# Patient Record
Sex: Male | Born: 1935 | Race: White | Hispanic: No | Marital: Married | Smoking: Current every day smoker
Health system: Southern US, Community
[De-identification: ages and names within clinical notes are randomized; demographics above are authoritative.]

## PROBLEM LIST (undated history)

## (undated) DIAGNOSIS — I10 Essential (primary) hypertension: Secondary | ICD-10-CM

## (undated) DIAGNOSIS — E785 Hyperlipidemia, unspecified: Secondary | ICD-10-CM

## (undated) DIAGNOSIS — I714 Abdominal aortic aneurysm, without rupture, unspecified: Secondary | ICD-10-CM

## (undated) DIAGNOSIS — K5792 Diverticulitis of intestine, part unspecified, without perforation or abscess without bleeding: Secondary | ICD-10-CM

## (undated) DIAGNOSIS — I639 Cerebral infarction, unspecified: Secondary | ICD-10-CM

## (undated) DIAGNOSIS — K219 Gastro-esophageal reflux disease without esophagitis: Secondary | ICD-10-CM

## (undated) HISTORY — PX: ABDOMINAL AORTIC ANEURYSM REPAIR: SUR1152

## (undated) HISTORY — PX: OTHER SURGICAL HISTORY: SHX169

---

## 2005-05-17 ENCOUNTER — Other Ambulatory Visit: Payer: Self-pay

## 2005-05-17 ENCOUNTER — Emergency Department: Payer: Self-pay | Admitting: Emergency Medicine

## 2006-01-16 ENCOUNTER — Ambulatory Visit: Payer: Self-pay | Admitting: Unknown Physician Specialty

## 2007-02-21 ENCOUNTER — Ambulatory Visit: Payer: Self-pay | Admitting: Ophthalmology

## 2007-03-13 ENCOUNTER — Ambulatory Visit: Payer: Self-pay | Admitting: Internal Medicine

## 2008-04-27 ENCOUNTER — Emergency Department: Payer: Self-pay | Admitting: Emergency Medicine

## 2008-09-09 ENCOUNTER — Ambulatory Visit: Payer: Self-pay | Admitting: Family Medicine

## 2009-01-06 ENCOUNTER — Ambulatory Visit: Payer: Self-pay | Admitting: Family Medicine

## 2009-09-23 ENCOUNTER — Ambulatory Visit: Payer: Self-pay | Admitting: Vascular Surgery

## 2009-10-14 ENCOUNTER — Emergency Department: Payer: Self-pay | Admitting: Emergency Medicine

## 2009-10-15 ENCOUNTER — Ambulatory Visit: Payer: Self-pay | Admitting: Vascular Surgery

## 2009-10-22 ENCOUNTER — Inpatient Hospital Stay: Payer: Self-pay | Admitting: Vascular Surgery

## 2011-08-09 IMAGING — XA IR VASCULAR PROCEDURE
7 series · 14 of 14 positions shown · IV contrast (IODINE)
Comparison: none

[Series 1: care aorta · 2 of 2 slices shown (1 of 7)]
[im 1/2]
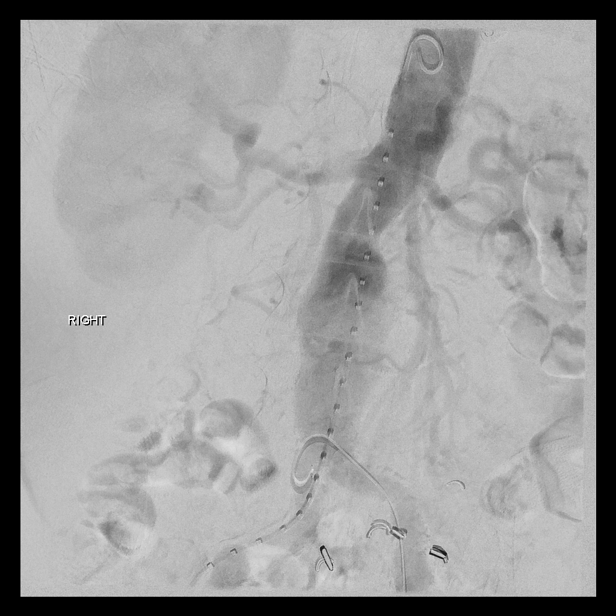
[im 2/2]
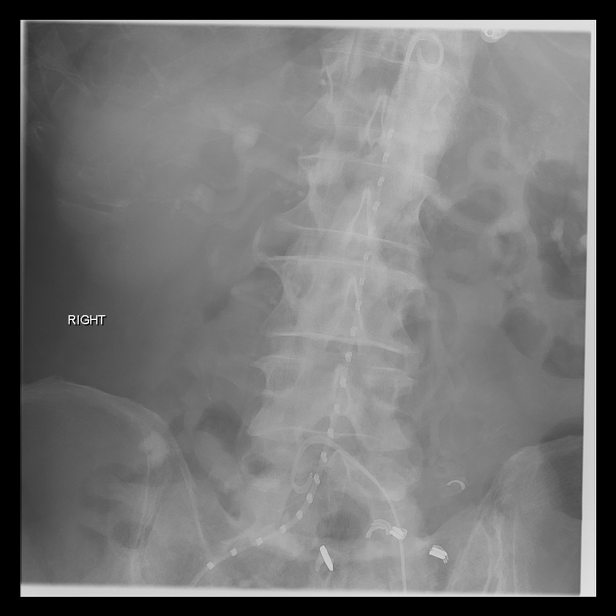

[Series 2: care aorta · 2 of 2 slices shown (2 of 7)]
[im 1/2]
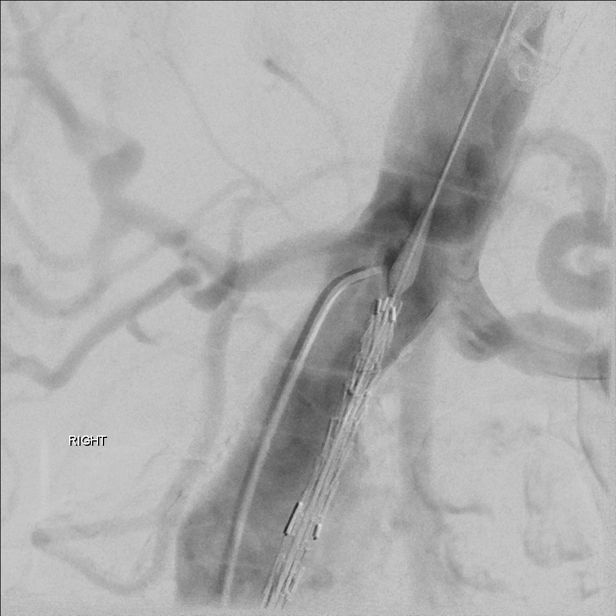
[im 2/2]
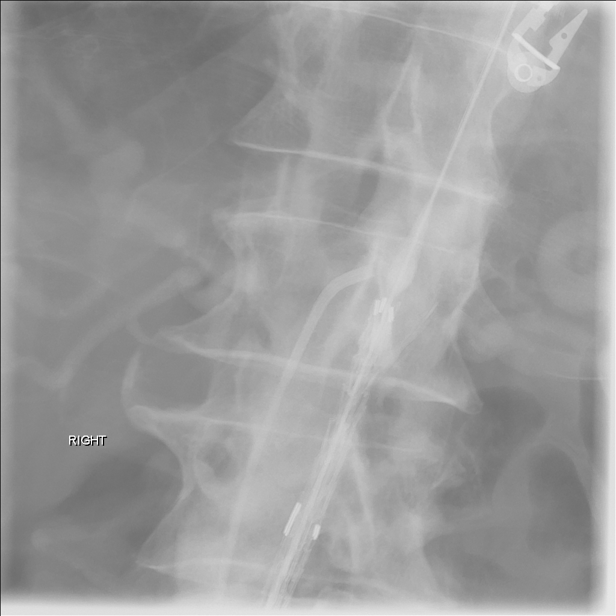

[Series 3: care aorta · 2 of 2 slices shown (3 of 7)]
[im 1/2]
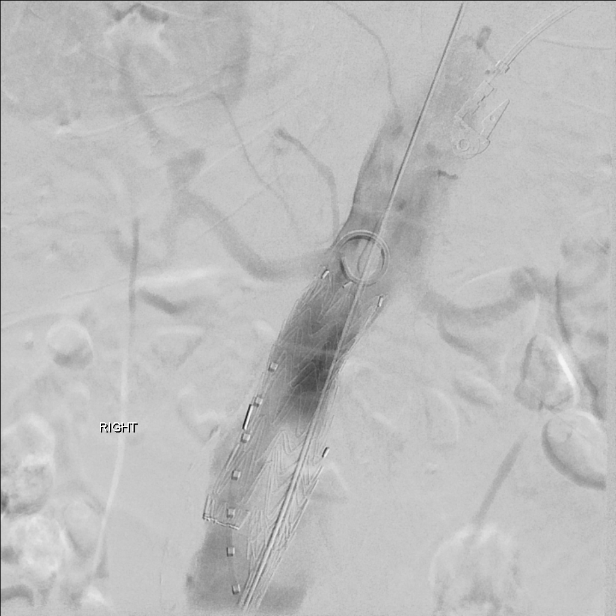
[im 2/2]
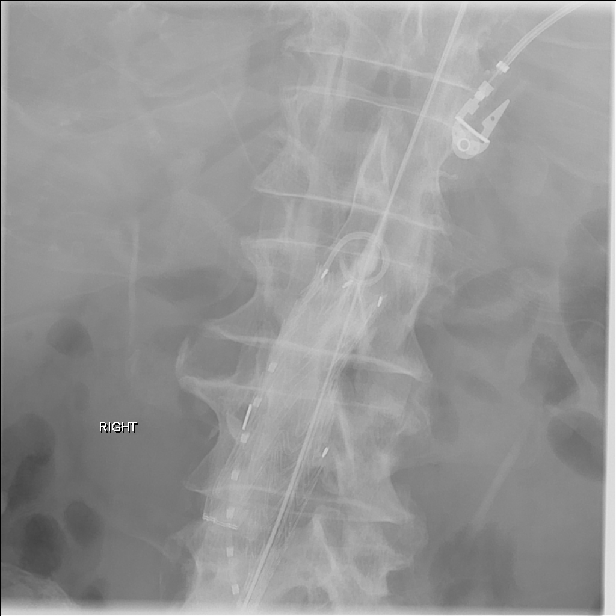

[Series 4: care aorta · 2 of 2 slices shown (4 of 7)]
[im 1/2]
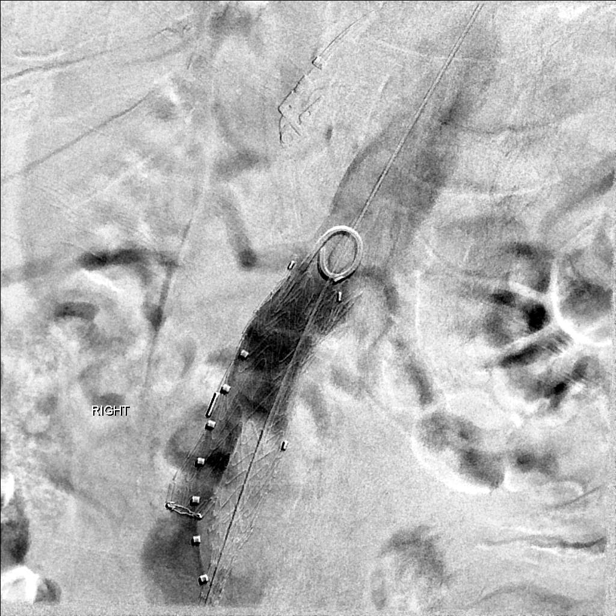
[im 2/2]
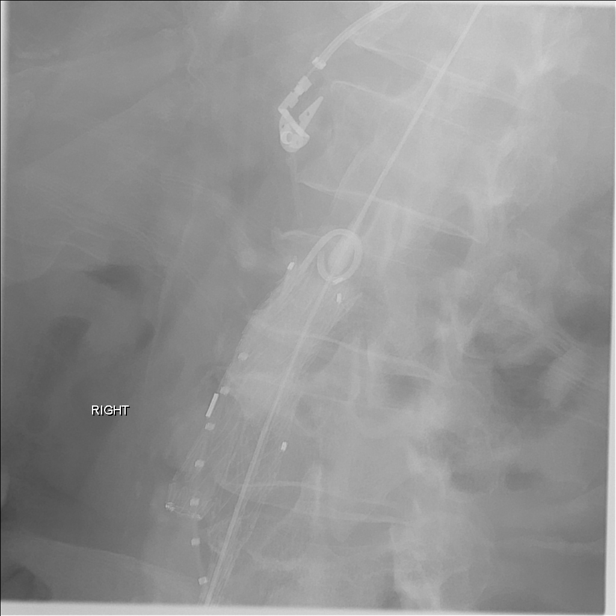

[Series 7: care aorta · 2 of 2 slices shown (5 of 7)]
[im 1/2]
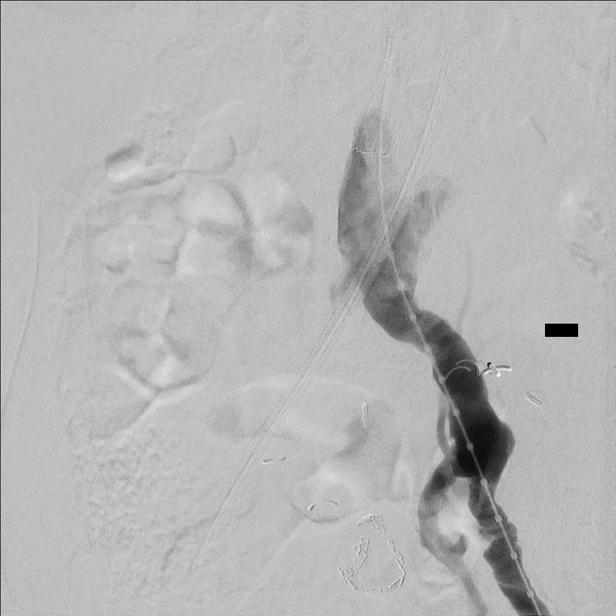
[im 2/2]
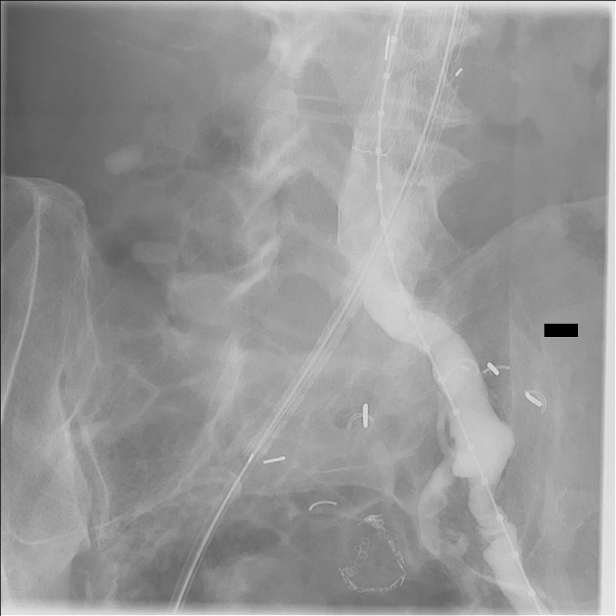

[Series 8: care aorta · 2 of 2 slices shown (6 of 7)]
[im 1/2]
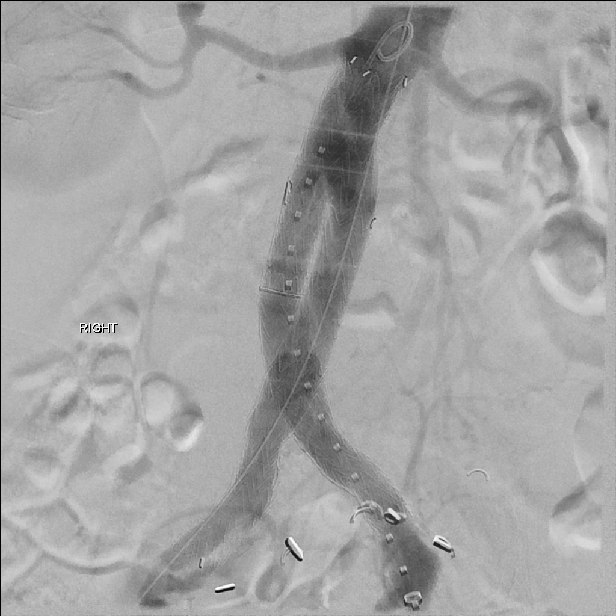
[im 2/2]
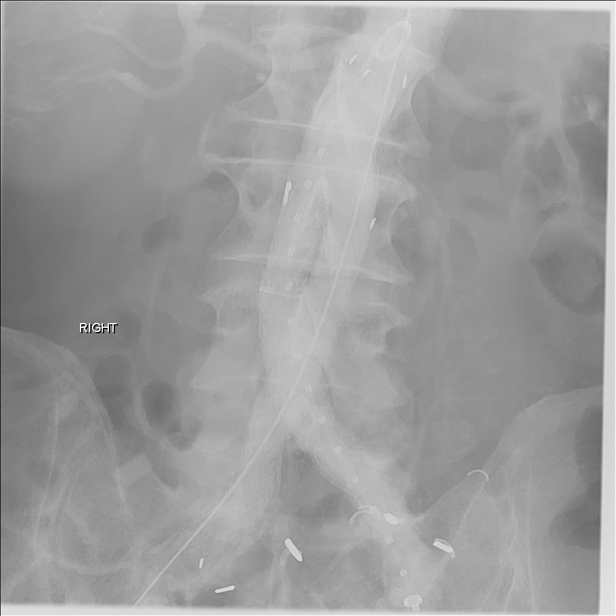

[Series 9: care aorta · 2 of 2 slices shown (7 of 7)]
[im 1/2]
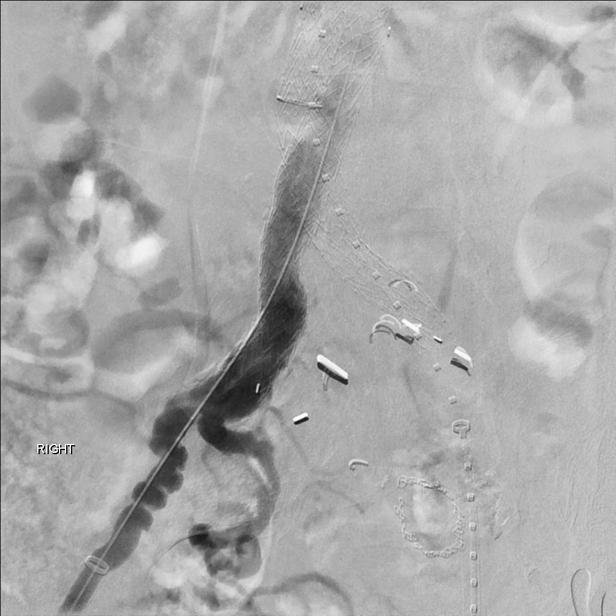
[im 2/2]
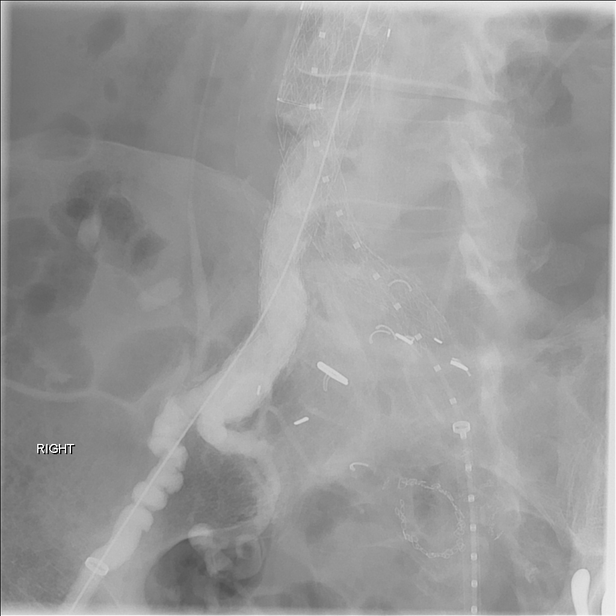

[14 of 14 positions shown; findings below may reference images not displayed]

IMAGES IMPORTED FROM THE SYNGO WORKFLOW SYSTEM
NO DICTATION FOR STUDY

## 2012-03-30 ENCOUNTER — Ambulatory Visit: Payer: Self-pay | Admitting: Sports Medicine

## 2016-07-08 ENCOUNTER — Emergency Department
Admission: EM | Admit: 2016-07-08 | Discharge: 2016-07-08 | Disposition: A | Discharge status: Home / Self Care | Payer: Medicare Other | Attending: Emergency Medicine | Admitting: Emergency Medicine

## 2016-07-08 ENCOUNTER — Encounter: Payer: Self-pay | Admitting: Medical Oncology

## 2016-07-08 DIAGNOSIS — I10 Essential (primary) hypertension: Secondary | ICD-10-CM

## 2016-07-08 DIAGNOSIS — I252 Old myocardial infarction: Secondary | ICD-10-CM | POA: Diagnosis not present

## 2016-07-08 DIAGNOSIS — Z8673 Personal history of transient ischemic attack (TIA), and cerebral infarction without residual deficits: Secondary | ICD-10-CM

## 2016-07-08 HISTORY — DX: Cerebral infarction, unspecified: I63.9

## 2016-07-08 HISTORY — DX: Abdominal aortic aneurysm, without rupture, unspecified: I71.40

## 2016-07-08 HISTORY — DX: Abdominal aortic aneurysm, without rupture: I71.4

## 2016-07-08 HISTORY — DX: Essential (primary) hypertension: I10

## 2016-07-08 MED ORDER — AMLODIPINE BESYLATE 5 MG PO TABS: 5.0000 mg | ORAL_TABLET | Freq: Every day | ORAL | 1 refills | Status: DC

## 2016-07-08 MED ORDER — ASPIRIN EC 81 MG PO TBEC: 81.0000 mg | DELAYED_RELEASE_TABLET | Freq: Every day | ORAL | 0 refills | Status: AC

## 2016-07-08 NOTE — ED Provider Notes (Signed)
[facility] Emergency Department Provider Note  ____________________________________________  Time seen: Approximately 9:28 AM  I have reviewed the triage vital signs and the nursing notes.   HISTORY  Chief Complaint Hypertension    HPI Dustin Townsend is a 83 y.o. male who sent to the ED for evaluation from the [facility] walk in clinic.He previously saw Dr. Dareen Piano, but hasn't been in about 3 years. He states that he stopped taking his blood pressure medicine and other medications about 2 years ago because he didn't like the way they made him feel. Last weekend episode where for 3 days he had decreased fine motor control in his right hand without frank paralysis. No other symptoms during that time. He started taking baby aspirin during that time and his hand function recovered. Denies any headaches or vision changes paresthesias or weakness anywhere else during the past week. No falls or head trauma. No chest pain belly pain or back pain.  Has a history of strokes in the past about 10 and 12 years ago. Has a history of AAA, status post endovascular repair about 6 years ago.     Allergies   Active Allergy Reactions Severity Noted Date Comments  Aspirin-Dipyridamole Headache  06/06/2013    Current Medications   Prescription Sig. Disp. Refills Start Date End Date Status  aspirin 81 MG EC tablet  Take 81 mg by mouth once daily.     Active  dutasteride (AVODART) 0.5 mg capsule  Take 0.5 mg by mouth once daily.     Active  pravastatin (PRAVACHOL) 10 MG tablet  Take 10 mg by mouth nightly.     Active  ACETAMINOPHEN (TYLENOL EXTRA STRENGTH ORAL)  Take by mouth.     Active  cholecalciferol (VITAMIN D3) 1,000 unit capsule  Take 1,000 Units by mouth once daily.     Active  omeprazole (PRILOSEC OTC) 20 MG tablet  Take 1 tablet (20 mg total) by mouth once daily. 90 tablet  3 03/05/2014  Active   Active Problems   Problem Noted  Date  HLD (hyperlipidemia), unspecified   Congenital foot deformity   CVA (cerebral vascular accident) (CMS-HCC)   GERD (gastroesophageal reflux disease)   Colon cancer (CMS-HCC)   Diverticulosis   Squamous cell skin cancer   Central retinal artery occlusion of left eye   Tobacco use   Colon adenoma, unspecified   AAA (abdominal aortic aneurysm) (CMS-HCC)   Overview:   s/p stent    Encounters - from Last 3 Months  Date Type Specialty Care Team Description  07/08/2016 Initial consult Internal Medicine Simonne Martinet, MD  Right arm weakness (Primary Dx);  History of CVA (cerebrovascular accident)   Immunizations   Name Dates Previously Given Next Due  Influenza, IM unspecified 10/15/2015    Surgical History   Surgery Date Laterality Comments  COLECTOMY PARTIAL W/ANASTAMOSIS     polypectomy (colon)     AAA stent         Past Medical History:  Diagnosis Date  . AAA (abdominal aortic aneurysm) (HCC)   . CVA (cerebral vascular accident) (HCC)   . Hypertension      There are no active problems to display for this patient.    Past Surgical History:  Procedure Laterality Date  . ABDOMINAL AORTIC ANEURYSM REPAIR       Prior to Admission medications   Not on File  None   Allergies Patient has no known allergies.   No family history on file.  Social History Social History  Substance Use Topics  . Smoking status: Not on file  . Smokeless tobacco: Not on file  . Alcohol use Not on file  No tobacco or alcohol use  Review of Systems  Constitutional:   No fever or chills.  ENT:   No sore throat. No rhinorrhea. Cardiovascular:   No chest pain or syncope. Respiratory:   No dyspnea or cough. Gastrointestinal:   Negative for abdominal pain, vomiting and diarrhea.  Musculoskeletal:   Negative for focal pain or swelling All other systems reviewed and are negative except as documented above in ROS and  HPI.  ____________________________________________   PHYSICAL EXAM:  VITAL SIGNS: ED Triage Vitals  Enc Vitals Group     BP 07/08/16 0836 (!) 177/107     Pulse Rate 07/08/16 0836 73     Resp 07/08/16 0836 18     Temp 07/08/16 0836 98 F (36.7 C)     Temp Source 07/08/16 0836 Oral     SpO2 07/08/16 0836 98 %     Weight 07/08/16 0836 170 lb (77.1 kg)     Height 07/08/16 0836 5\' 8"  (1.727 m)     Head Circumference --      [facility] Flow --      Pain Score 07/08/16 0835 0     Pain Loc --      Pain Edu? --      Excl. in GC? --     Vital signs reviewed, nursing assessments reviewed.   Constitutional:   Alert and oriented. Well appearing and in no distress. Eyes:   No scleral icterus.  EOMI. No nystagmus. No conjunctival pallor. PERRL. ENT   Head:   Normocephalic and atraumatic.   Nose:   No congestion/rhinnorhea.    Mouth/Throat:   MMM, no pharyngeal erythema. No peritonsillar mass.    Neck:   No meningismus. Full ROM Hematological/Lymphatic/Immunilogical:   No cervical lymphadenopathy. Cardiovascular:   RRR. Symmetric bilateral radial and DP pulses.  No murmurs.  Respiratory:   Normal respiratory effort without tachypnea/retractions. Breath sounds are clear and equal bilaterally. No wheezes/rales/rhonchi. Gastrointestinal:   Soft and nontender. Non distended. There is no CVA tenderness.  No rebound, rigidity, or guarding. Genitourinary:   deferred Musculoskeletal:   Normal range of motion in all extremities. No joint effusions.  No lower extremity tenderness.  No edema. Neurologic:   Normal speech and language.  Normal coordination Negative pronator drift. Normal finger-nose-finger. Motor grossly intact. No gross focal neurologic deficits are appreciated.  Skin:    Skin is warm, dry and intact. No rash noted.  No petechiae, purpura, or bullae.  ____________________________________________    LABS (pertinent positives/negatives) (all labs ordered are listed,  but only abnormal results are displayed) Labs Reviewed - No data to display ____________________________________________   EKG  Interpreted by me Sinus rhythm rate of 65, normal axis and intervals. Incomplete right bundle branch block. Normal ST segments and T waves. No ischemic changes  ____________________________________________    RADIOLOGY  No results found.  ____________________________________________   PROCEDURES Procedures  ____________________________________________   INITIAL IMPRESSION / ASSESSMENT AND PLAN / ED COURSE  Pertinent labs & imaging results that were available during my care of the patient were reviewed by me and considered in my medical decision making (see chart for details).  Patient well appearing no acute distress, currently asymptomatic, presents with uncontrolled essential hypertension as well as symptoms possibly due to ischemic stroke 1 week ago. He is currently asymptomatic, neurologically  intact, suitable for further outpatient management. I'll start him on amlodipine for blood pressure control and daily baby aspirin in the meantime. Counseled him on the need for outpatient stroke workup as well as restarting statin and probably further blood pressure control. I feel that starting all these medications today will likely cause significant side effects and cause the patient to stop taking his medicines again, as he stopped taking them 2 years ago because he didn't like the side effects. See no reason to do a CT head or MRI brain in the ED today, patient does not require hospitalization. Counseled extensively on concerns about the blood pressure and importance of medication compliance      ____________________________________________   FINAL CLINICAL IMPRESSION(S) / ED DIAGNOSES  Final diagnoses:  Essential hypertension  History of stroke in prior 3 months      New Prescriptions   No medications on file     Portions of this note  were generated with dragon dictation software. Dictation errors may occur despite best attempts at proofreading.    Sharman Cheek, MD 07/08/16 [phone]

## 2016-07-08 NOTE — ED Triage Notes (Signed)
Pt reports that he noticed last week that he lost control of his rt hand and couldn't write for a couple of days, that has now resolved. Pt is A/O x 4 with no weakness noted. Pt talking without difficulty. Pt [facility] with [facility], pt states that he has not taken his medications in over 2 years.

## 2016-07-08 NOTE — Discharge Instructions (Signed)
Follow-up with your primary care doctor for continued management of your blood pressure. You will also need outpatient evaluation for stroke risk factors, as well as restarting your statin therapy.  Take amlodipine for blood pressure and aspirin in the meantime.

## 2018-01-15 ENCOUNTER — Inpatient Hospital Stay
Admission: EM | Admit: 2018-01-15 | Discharge: 2018-01-19 | DRG: 470 | Disposition: A | Discharge status: Skilled Nursing Facility | Payer: Medicare Other | Attending: Internal Medicine | Admitting: Internal Medicine

## 2018-01-15 ENCOUNTER — Other Ambulatory Visit: Payer: Self-pay

## 2018-01-15 ENCOUNTER — Emergency Department: Payer: Medicare Other

## 2018-01-15 ENCOUNTER — Encounter: Payer: Self-pay | Admitting: Emergency Medicine

## 2018-01-15 DIAGNOSIS — Z96649 Presence of unspecified artificial hip joint: Secondary | ICD-10-CM

## 2018-01-15 DIAGNOSIS — Z716 Tobacco abuse counseling: Secondary | ICD-10-CM

## 2018-01-15 DIAGNOSIS — Z8719 Personal history of other diseases of the digestive system: Secondary | ICD-10-CM

## 2018-01-15 DIAGNOSIS — R52 Pain, unspecified: Secondary | ICD-10-CM

## 2018-01-15 DIAGNOSIS — K219 Gastro-esophageal reflux disease without esophagitis: Secondary | ICD-10-CM | POA: Diagnosis present

## 2018-01-15 DIAGNOSIS — R338 Other retention of urine: Secondary | ICD-10-CM | POA: Diagnosis not present

## 2018-01-15 DIAGNOSIS — Z419 Encounter for procedure for purposes other than remedying health state, unspecified: Secondary | ICD-10-CM

## 2018-01-15 DIAGNOSIS — E876 Hypokalemia: Secondary | ICD-10-CM | POA: Diagnosis not present

## 2018-01-15 DIAGNOSIS — I10 Essential (primary) hypertension: Secondary | ICD-10-CM | POA: Diagnosis present

## 2018-01-15 DIAGNOSIS — E785 Hyperlipidemia, unspecified: Secondary | ICD-10-CM | POA: Diagnosis present

## 2018-01-15 DIAGNOSIS — Z8679 Personal history of other diseases of the circulatory system: Secondary | ICD-10-CM | POA: Diagnosis not present

## 2018-01-15 DIAGNOSIS — Z9049 Acquired absence of other specified parts of digestive tract: Secondary | ICD-10-CM

## 2018-01-15 DIAGNOSIS — K5900 Constipation, unspecified: Secondary | ICD-10-CM | POA: Diagnosis not present

## 2018-01-15 DIAGNOSIS — W010XXA Fall on same level from slipping, tripping and stumbling without subsequent striking against object, initial encounter: Secondary | ICD-10-CM | POA: Diagnosis present

## 2018-01-15 DIAGNOSIS — Z79899 Other long term (current) drug therapy: Secondary | ICD-10-CM

## 2018-01-15 DIAGNOSIS — M25552 Pain in left hip: Secondary | ICD-10-CM | POA: Diagnosis present

## 2018-01-15 DIAGNOSIS — I69351 Hemiplegia and hemiparesis following cerebral infarction affecting right dominant side: Secondary | ICD-10-CM

## 2018-01-15 DIAGNOSIS — Y92129 Unspecified place in nursing home as the place of occurrence of the external cause: Secondary | ICD-10-CM | POA: Diagnosis not present

## 2018-01-15 DIAGNOSIS — Z8249 Family history of ischemic heart disease and other diseases of the circulatory system: Secondary | ICD-10-CM | POA: Diagnosis not present

## 2018-01-15 DIAGNOSIS — S72002A Fracture of unspecified part of neck of left femur, initial encounter for closed fracture: Secondary | ICD-10-CM

## 2018-01-15 DIAGNOSIS — S72009A Fracture of unspecified part of neck of unspecified femur, initial encounter for closed fracture: Secondary | ICD-10-CM | POA: Diagnosis present

## 2018-01-15 DIAGNOSIS — S72112A Displaced fracture of greater trochanter of left femur, initial encounter for closed fracture: Secondary | ICD-10-CM | POA: Diagnosis present

## 2018-01-15 DIAGNOSIS — F17200 Nicotine dependence, unspecified, uncomplicated: Secondary | ICD-10-CM | POA: Diagnosis present

## 2018-01-15 DIAGNOSIS — W19XXXA Unspecified fall, initial encounter: Secondary | ICD-10-CM

## 2018-01-15 HISTORY — DX: Gastro-esophageal reflux disease without esophagitis: K21.9

## 2018-01-15 HISTORY — DX: Hyperlipidemia, unspecified: E78.5

## 2018-01-15 HISTORY — DX: Diverticulitis of intestine, part unspecified, without perforation or abscess without bleeding: K57.92

## 2018-01-15 LAB — CBC WITH DIFFERENTIAL/PLATELET
Abs Immature Granulocytes: 0.03 10*3/uL (ref 0.00–0.07)
BASOS ABS: 0 10*3/uL (ref 0.0–0.1)
Basophils Relative: 0 %
EOS ABS: 0.1 10*3/uL (ref 0.0–0.5)
EOS PCT: 1 %
HEMATOCRIT: 41.8 % (ref 39.0–52.0)
Hemoglobin: 14.3 g/dL (ref 13.0–17.0)
Immature Granulocytes: 0 %
LYMPHS ABS: 1.9 10*3/uL (ref 0.7–4.0)
Lymphocytes Relative: 21 %
MCH: 33.6 pg (ref 26.0–34.0)
MCHC: 34.2 g/dL (ref 30.0–36.0)
MCV: 98.4 fL (ref 80.0–100.0)
MONO ABS: 0.5 10*3/uL (ref 0.1–1.0)
MONOS PCT: 6 %
NRBC: 0 % (ref 0.0–0.2)
Neutro Abs: 6.4 10*3/uL (ref 1.7–7.7)
Neutrophils Relative %: 72 %
Platelets: 196 10*3/uL (ref 150–400)
RBC: 4.25 MIL/uL (ref 4.22–5.81)
RDW: 13.1 % (ref 11.5–15.5)
WBC: 8.9 10*3/uL (ref 4.0–10.5)

## 2018-01-15 LAB — BASIC METABOLIC PANEL
Anion gap: 9 (ref 5–15)
BUN: 22 mg/dL (ref 8–23)
CO2: 24 mmol/L (ref 22–32)
CREATININE: 0.85 mg/dL (ref 0.61–1.24)
Calcium: 9.3 mg/dL (ref 8.9–10.3)
Chloride: 105 mmol/L (ref 98–111)
GFR calc Af Amer: >60 mL/min (ref >60)
GFR calc non Af Amer: >60 mL/min (ref >60)
GLUCOSE: 106 mg/dL — AB (ref 70–99)
Potassium: 3.9 mmol/L (ref 3.5–5.1)
Sodium: 138 mmol/L (ref 135–145)

## 2018-01-15 LAB — TYPE AND SCREEN
ABO/RH(D): O NEG
Antibody Screen: NEGATIVE

## 2018-01-15 LAB — SURGICAL PCR SCREEN
MRSA, PCR: NEGATIVE
Staphylococcus aureus: NEGATIVE

## 2018-01-15 MED ORDER — TRAMADOL HCL 50 MG PO TABS
50.0000 mg | ORAL_TABLET | Freq: Four times a day (QID) | ORAL | Status: DC | PRN
  Administered 2018-01-15: 50 mg via ORAL
  Filled 2018-01-15: qty 1

## 2018-01-15 MED ORDER — HYDROMORPHONE HCL 1 MG/ML IJ SOLN
0.5000 mg | Freq: Once | INTRAMUSCULAR | Status: AC
  Administered 2018-01-15: 0.5 mg via INTRAVENOUS
  Filled 2018-01-15: qty 1

## 2018-01-15 MED ORDER — HYDRALAZINE HCL 20 MG/ML IJ SOLN: 10.0000 mg | Freq: Four times a day (QID) | INTRAMUSCULAR | Status: DC | PRN

## 2018-01-15 MED ORDER — SODIUM CHLORIDE 0.9 % IV SOLN
INTRAVENOUS | Status: DC
  Administered 2018-01-15: 16:00:00 via INTRAVENOUS

## 2018-01-15 MED ORDER — LISINOPRIL 20 MG PO TABS
20.0000 mg | ORAL_TABLET | Freq: Every day | ORAL | Status: DC
  Administered 2018-01-16: 20 mg via ORAL
  Filled 2018-01-15: qty 2

## 2018-01-15 MED ORDER — HYDROMORPHONE HCL 1 MG/ML IJ SOLN
1.0000 mg | INTRAMUSCULAR | Status: DC | PRN
  Administered 2018-01-15 – 2018-01-16 (×4): 1 mg via INTRAVENOUS
  Filled 2018-01-15 (×4): qty 1

## 2018-01-15 MED ORDER — HYDROCHLOROTHIAZIDE 25 MG PO TABS
25.0000 mg | ORAL_TABLET | Freq: Every day | ORAL | Status: DC
  Administered 2018-01-16: 25 mg via ORAL
  Filled 2018-01-15: qty 1

## 2018-01-15 MED ORDER — ONDANSETRON HCL 4 MG PO TABS: 4.0000 mg | ORAL_TABLET | Freq: Four times a day (QID) | ORAL | Status: DC | PRN

## 2018-01-15 MED ORDER — PRAVASTATIN SODIUM 20 MG PO TABS
10.0000 mg | ORAL_TABLET | Freq: Every day | ORAL | Status: DC
  Administered 2018-01-16 – 2018-01-17 (×2): 10 mg via ORAL
  Filled 2018-01-15 (×4): qty 1

## 2018-01-15 MED ORDER — HYDROMORPHONE HCL 1 MG/ML IJ SOLN: 2.0000 mg | INTRAMUSCULAR | Status: DC | PRN

## 2018-01-15 MED ORDER — ONDANSETRON HCL 4 MG/2ML IJ SOLN: 4.0000 mg | Freq: Four times a day (QID) | INTRAMUSCULAR | Status: DC | PRN

## 2018-01-15 MED ORDER — SODIUM CHLORIDE 0.9 % IV SOLN
INTRAVENOUS | Status: DC
  Administered 2018-01-15 – 2018-01-16 (×2): via INTRAVENOUS

## 2018-01-15 MED ORDER — MUPIROCIN 2 % EX OINT
1.0000 "application " | TOPICAL_OINTMENT | Freq: Two times a day (BID) | CUTANEOUS | Status: DC
  Administered 2018-01-15: 1 via NASAL
  Filled 2018-01-15: qty 22

## 2018-01-15 MED ORDER — ACETAMINOPHEN 650 MG RE SUPP: 650.0000 mg | Freq: Four times a day (QID) | RECTAL | Status: DC | PRN

## 2018-01-15 MED ORDER — ACETAMINOPHEN 325 MG PO TABS: 650.0000 mg | ORAL_TABLET | Freq: Four times a day (QID) | ORAL | Status: DC | PRN

## 2018-01-15 MED ORDER — DUTASTERIDE 0.5 MG PO CAPS
0.5000 mg | ORAL_CAPSULE | Freq: Every day | ORAL | Status: DC
  Administered 2018-01-17 – 2018-01-19 (×3): 0.5 mg via ORAL
  Filled 2018-01-15 (×5): qty 1

## 2018-01-15 NOTE — ED Provider Notes (Signed)
ED ECG REPORT I, Anne-Caroline Sharma Covert, the attending physician, personally viewed and interpreted this ECG.   Date: 01/15/2018  EKG Time:1554  Rate: 68  Rhythm: normal sinus rhythm; + PVC  Axis: normal  Intervals:none  ST&T Change: No STEMI    Rockne Menghini, MD 01/15/18 1559

## 2018-01-15 NOTE — Progress Notes (Addendum)
    01/15/18 1725  Clinical Encounter Type  Visited With Patient and family together  Visit Type Spiritual support;Initial  Referral From Physician  Consult/Referral To Chaplain  Spiritual Encounters  Spiritual Needs Emotional  Stress Factors  Patient Stress Factors Exhausted;Health changes;Major life changes;Lack of knowledge  Family Stress Factors Not reviewed  Advance Directives (For Healthcare)  Does Patient Have a Medical Advance Directive? No  Would patient like information on creating a medical advance directive? Yes (Inpatient - patient requests chaplain consult to create a medical advance directive)  Mental Health Advance Directives  Does Patient Have a Mental Health Advance Directive? No  Would patient like information on creating a mental health advance directive? No - Patient declined   Received a OR on pt regarding an Adv directive. Visited with pt briefly to find out why he is here. Pt had just been placed in the room from ER. Spoke briefly about the adv directive w/ pt who had 2 sons present. Educated the pt and the sons on the purpose of the adv directive. Pt shared how he became hospitalized w/ hip fracture and that it would require surgery. Chaplain noticed how weary the pt was and the presence of the family and decided that f/u would be best considering he just moved form the ER. Chaplain joked w/ sons and the pt and assured them that someone could talk to him a time that would be more convenient. Contact was made w/ the nurse and physician via chat and page to update the care team.

## 2018-01-15 NOTE — Consult Note (Signed)
ORTHOPAEDIC CONSULTATION  REQUESTING PHYSICIAN: Auburn Bilberry, MD  Chief Complaint:   L hip pain  History of Present Illness: Dustin Townsend is a 85 y.o. male who had a fall earlier today [facility].  The patient noted immediate hip pain and inability to ambulate.  The patient ambulates unassisted at baseline and lives independently with his wife.  Pain is described as sharp at its worst and a dull ache at its best.  Pain is rated a 10 out of 10 in severity.  Pain is improved with rest and immobilization.  Pain is worse with any sort of movement.  X-rays in the emergency department show a left femoral neck fracture.  Past Medical History:  Diagnosis Date  . AAA (abdominal aortic aneurysm) (HCC)   . CVA (cerebral vascular accident) (HCC)   . Diverticulitis   . GERD (gastroesophageal reflux disease)   . Hyperlipemia   . Hypertension    Past Surgical History:  Procedure Laterality Date  . ABDOMINAL AORTIC ANEURYSM REPAIR    . colectomy     Social History   Socioeconomic History  . Marital status: Married    Spouse name: Not on file  . Number of children: Not on file  . Years of education: Not on file  . Highest education level: Not on file  Occupational History  . Not on file  Social Needs  . Financial resource strain: Not on file  . Food insecurity:    Worry: Not on file    Inability: Not on file  . Transportation needs:    Medical: Not on file    Non-medical: Not on file  Tobacco Use  . Smoking status: Current Every Day Smoker  . Smokeless tobacco: Never Used  Substance and Sexual Activity  . Alcohol use: Not on file  . Drug use: Not on file  . Sexual activity: Not on file  Lifestyle  . Physical activity:    Days per week: Not on file    Minutes per session: Not on file  . Stress: Not on file  Relationships  . Social connections:    Talks on phone: Not on file     Gets together: Not on file    Attends religious service: Not on file    Active member of club or organization: Not on file    Attends meetings of clubs or organizations: Not on file    Relationship status: Not on file  Other Topics Concern  . Not on file  Social History Narrative  . Not on file   Family History  Problem Relation Age of Onset  . Hypertension Mother    No Known Allergies Prior to Admission medications   Medication Sig Start Date End Date Taking? Authorizing Provider  amLODipine (NORVASC) 5 MG tablet Take 1 tablet (5 mg total) by mouth daily. 07/08/16   Sharman Cheek, MD   Recent Labs    01/15/18 1504  WBC 8.9  HGB 14.3  HCT 41.8  PLT 196  K 3.9  CL 105  CO2 24  BUN 22  CREATININE 0.85  GLUCOSE 106*  CALCIUM 9.3     Dg Chest 1 View  Result Date: 01/15/2018 CLINICAL DATA:  Loss of balance.  Fall. EXAM: CHEST  1 VIEW COMPARISON:  Chest x-ray 10/15/2009. FINDINGS: Mediastinum hilar structures normal. Cardiomegaly. No pulmonary venous congestion. No focal infiltrate. No pleural effusion or pneumothorax. No acute bony abnormality identified. IMPRESSION: 1.  Cardiomegaly.  No  pulmonary venous congestion. 2.  No acute pulmonary disease. Electronically Signed   By: Maisie Fus  Register   On: 01/15/2018 14:27   Ct Hip Left Wo Contrast  Result Date: 01/15/2018 CLINICAL DATA:  Fracture of the proximal left femur secondary to a fall. EXAM: CT OF THE LEFT HIP WITHOUT CONTRAST TECHNIQUE: Multidetector CT imaging of the left hip was performed according to the standard protocol. Multiplanar CT image reconstructions were also generated. COMPARISON:  Radiographs dated 01/15/2018 FINDINGS: Bones/Joint/Cartilage There is a minimally comminuted fracture of the neck of the proximal left femur with slight angulation and impaction. Fracture does not involve the trochanters. There is no dislocation. Soft tissues Atherosclerosis.  No acute soft tissue abnormalities. IMPRESSION:  Slightly angulated slightly impacted left femoral neck fracture. Electronically Signed   By: Francene Boyers M.D.   On: 01/15/2018 15:54   Dg Hip Unilat W Or Wo Pelvis 2-3 Views Left  Result Date: 01/15/2018 CLINICAL DATA:  Status post fall with left hip pain. EXAM: DG HIP (WITH OR WITHOUT PELVIS) 2-3V LEFT COMPARISON:  None. FINDINGS: There is an oblique mildly comminuted impacted fracture of the left distal femoral neck extending to the greater trochanter. There is mild superior displacement of the distal fracture fragment. Postsurgical changes in the pelvis noted. IMPRESSION: Oblique impacted intertrochanteric left femoral fracture. Electronically Signed   By: Ted Mcalpine M.D.   On: 01/15/2018 14:23     Positive ROS: All other systems have been reviewed and were otherwise negative with the exception of those mentioned in the HPI and as above.  Physical Exam: BP (!) 162/92 (BP Location: Right Arm)   Pulse 63   Temp 98 F (36.7 C) (Oral)   Resp 18   Ht 5\' 11"  (1.803 m)   Wt 83.9 kg   SpO2 99%   BMI 25.80 kg/m  General:  Alert, no acute distress Psychiatric:  Patient is competent for consent with normal mood and affect   Cardiovascular:  No pedal edema, regular rate and rhythm Respiratory:  No wheezing, non-labored breathing GI:  Abdomen is soft and non-tender Skin:  No lesions in the area of chief complaint, no erythema Neurologic:  Sensation intact distally, CN grossly intact Lymphatic:  No axillary or cervical lymphadenopathy  Orthopedic Exam:  LLE: + DF/PF/EHL SILT grossly over foot Foot wwp +Log roll/axial load   X-rays:  As above: L displaced femoral neck fracture  Assessment/Plan: Dustin Townsend is a 85 y.o. male with a L displaced femoral neck fracture   1. I discussed the various treatment options including both surgical and non-surgical management of her fracture with the patient and family. We discussed the high risk of perioperative complications due to  patient's age and other co-morbidities. After discussion of risks, benefits, and alternatives to surgery, the family and patient were in agreement to proceed with surgery. The goals of surgery would be to provide adequate pain relief and allow for early mobilization. Plan for surgery is L hip hemiarthroplasty tomorrow, 01/16/18. 2. NPO after midnight 3. Hold anticoagulation in advance of OR 4. Admit to Hospitalist service     Signa Kell   01/15/2018 6:58 PM

## 2018-01-15 NOTE — Progress Notes (Signed)
Notified Dr. Auburn Bilberry that surgery will not be done tonight per Dr. Signa Kell and patient can eat. MD to place order for diet.

## 2018-01-15 NOTE — H&P (Signed)
[employer] [facility] - [place] [facility]   PATIENT NAME: Dustin Townsend    MR#:  [id number]  DATE OF BIRTH:  27-Sep-1933  DATE OF ADMISSION:  01/15/2018  PRIMARY CARE PHYSICIAN: Patient, No Pcp Per   REQUESTING/REFERRING PHYSICIAN: Nona Dell, PA  CHIEF COMPLAINT:   Chief Complaint  Patient presents with  . Fall  . Hip Pain    HISTORY OF PRESENT ILLNESS: Dustin Townsend  is a 85 y.o. male with a known history of abdominal aortic aneurysm, previous CVA, diverticulitis, GERD, hyperlipidemia and hypertension who is presenting to the hospital with fall.  Patient states that he was throwing balloon on the wall with his wife who is in rehab.  Patient fell.  In the ER he was noted to have a hip fracture.  He otherwise states that he does have some gait trouble.  Denies any chest pain or shortness of breath.   PAST MEDICAL HISTORY:   Past Medical History:  Diagnosis Date  . AAA (abdominal aortic aneurysm) (HCC)   . CVA (cerebral vascular accident) (HCC)   . Diverticulitis   . GERD (gastroesophageal reflux disease)   . Hyperlipemia   . Hypertension     PAST SURGICAL HISTORY:  Past Surgical History:  Procedure Laterality Date  . ABDOMINAL AORTIC ANEURYSM REPAIR    . colectomy      SOCIAL HISTORY:  Social History   Tobacco Use  . Smoking status: Current Every Day Smoker  . Smokeless tobacco: Never Used  Substance Use Topics  . Alcohol use: Not on file    FAMILY HISTORY:  Family History  Problem Relation Age of Onset  . Hypertension Mother     DRUG ALLERGIES: No Known Allergies  REVIEW OF SYSTEMS:   CONSTITUTIONAL: No fever, fatigue or weakness.  EYES: No blurred or double vision.  EARS, NOSE, AND THROAT: No tinnitus or ear pain.  RESPIRATORY: No cough, shortness of breath, wheezing or hemoptysis.  CARDIOVASCULAR: No chest pain, orthopnea, edema.  GASTROINTESTINAL: No nausea, vomiting, diarrhea or abdominal pain.  GENITOURINARY: No dysuria, hematuria.   ENDOCRINE: No polyuria, nocturia,  HEMATOLOGY: No anemia, easy bruising or bleeding SKIN: No rash or lesion. MUSCULOSKELETAL: Positive hip pain  nEUROLOGIC: No tingling, numbness, weakness.  PSYCHIATRY: No anxiety or depression.   MEDICATIONS AT HOME:  Prior to Admission medications   Medication Sig Start Date End Date Taking? Authorizing Provider  amLODipine (NORVASC) 5 MG tablet Take 1 tablet (5 mg total) by mouth daily. 07/08/16   Sharman Cheek, MD      PHYSICAL EXAMINATION:   VITAL SIGNS: Blood pressure (!) 190/110, pulse 80, temperature 98.4 F (36.9 C), temperature source Oral, resp. rate 20, height 5\' 11"  (1.803 m), weight 83.9 kg, SpO2 97 %.  GENERAL:  85 y.o.-year-old patient lying in the bed with no acute distress.  EYES: Pupils equal, round, reactive to light and accommodation. No scleral icterus. Extraocular muscles intact.  HEENT: Head atraumatic, normocephalic. Oropharynx and nasopharynx clear.  NECK:  Supple, no jugular venous distention. No thyroid enlargement, no tenderness.  LUNGS: Normal breath sounds bilaterally, no wheezing, rales,rhonchi or crepitation. No use of accessory muscles of respiration.  CARDIOVASCULAR: S1, S2 normal. No murmurs, rubs, or gallops.  ABDOMEN: Soft, nontender, nondistended. Bowel sounds present. No organomegaly or mass.  EXTREMITIES: No pedal edema, cyanosis, or clubbing.  NEUROLOGIC: Cranial nerves II through XII are intact. Muscle strength 5/5 in all extremities. Sensation intact. Gait not checked.  PSYCHIATRIC: The patient is alert and oriented x  3.  SKIN: No obvious rash, lesion, or ulcer.   LABORATORY PANEL:   CBC Recent Labs  Lab 01/15/18 1504  WBC 8.9  HGB 14.3  HCT 41.8  PLT 196  MCV 98.4  MCH 33.6  MCHC 34.2  RDW 13.1  LYMPHSABS 1.9  MONOABS 0.5  EOSABS 0.1  BASOSABS 0.0   ------------------------------------------------------------------------------------------------------------------  Chemistries  No  results for input(s): NA, K, CL, CO2, GLUCOSE, BUN, CREATININE, CALCIUM, MG, AST, ALT, ALKPHOS, BILITOT in the last 168 hours.  Invalid input(s): GFRCGP ------------------------------------------------------------------------------------------------------------------ CrCl cannot be calculated (No successful lab value found.). ------------------------------------------------------------------------------------------------------------------ No results for input(s): TSH, T4TOTAL, T3FREE, THYROIDAB in the last 72 hours.  Invalid input(s): FREET3   Coagulation profile No results for input(s): INR, PROTIME in the last 168 hours. ------------------------------------------------------------------------------------------------------------------- No results for input(s): DDIMER in the last 72 hours. -------------------------------------------------------------------------------------------------------------------  Cardiac Enzymes No results for input(s): CKMB, TROPONINI, MYOGLOBIN in the last 168 hours.  Invalid input(s): CK ------------------------------------------------------------------------------------------------------------------ Invalid input(s): POCBNP  ---------------------------------------------------------------------------------------------------------------  Urinalysis No results found for: COLORURINE, APPEARANCEUR, LABSPEC, PHURINE, GLUCOSEU, HGBUR, BILIRUBINUR, KETONESUR, PROTEINUR, UROBILINOGEN, NITRITE, LEUKOCYTESUR   RADIOLOGY: Dg Chest 1 View  Result Date: 01/15/2018 CLINICAL DATA:  Loss of balance.  Fall. EXAM: CHEST  1 VIEW COMPARISON:  Chest x-ray 10/15/2009. FINDINGS: Mediastinum hilar structures normal. Cardiomegaly. No pulmonary venous congestion. No focal infiltrate. No pleural effusion or pneumothorax. No acute bony abnormality identified. IMPRESSION: 1.  Cardiomegaly.  No pulmonary venous congestion. 2.  No acute pulmonary disease. Electronically Signed   By:  Maisie Fus  Register   On: 01/15/2018 14:27   Dg Hip Unilat W Or Wo Pelvis 2-3 Views Left  Result Date: 01/15/2018 CLINICAL DATA:  Status post fall with left hip pain. EXAM: DG HIP (WITH OR WITHOUT PELVIS) 2-3V LEFT COMPARISON:  None. FINDINGS: There is an oblique mildly comminuted impacted fracture of the left distal femoral neck extending to the greater trochanter. There is mild superior displacement of the distal fracture fragment. Postsurgical changes in the pelvis noted. IMPRESSION: Oblique impacted intertrochanteric left femoral fracture. Electronically Signed   By: Ted Mcalpine M.D.   On: 01/15/2018 14:23    EKG: Orders placed or performed during the hospital encounter of 01/15/18  . ED EKG  . ED EKG    IMPRESSION AND PLAN: Patient is 85 year old with history of hypertension hyperlipidemia presenting with fall  1.  Left femoral fracture orthopedics have been notified Plan for patient to go to surgery later N.p.o. Patient denies any cardiopulmonary symptoms medically cleared for surgery Await EKG evaluation CBC and BMP  2 accelerated hypertension I will place patient on IV hydralazine as needed Continue lisinopril HCTZ is taken at home  3.  Hyperlipidemia continue Pravachol  4.  Miscellaneous SCDs for DVT prophylaxis    All the records are reviewed and case discussed with ED provider. Management plans discussed with the patient, family and they are in agreement.  CODE STATUS: Full code   TOTAL TIME TAKING CARE OF THIS PATIENT:59minutes.    Auburn Bilberry M.D on 01/15/2018 at 3:29 PM  Between 7am to 6pm - Pager - [phone]  After 6pm go to [email or web address] - password [email or web address] [facility]  [employer] [facility] Office  [phone]  CC: Primary care physician; Patient, No Pcp Per

## 2018-01-15 NOTE — ED Triage Notes (Signed)
Presents vis EMS s/p fall  States he lost his balance while visiting wife at Nursing home landed on left hip   Having increased pain with standing   Also has skin tear to left thumb

## 2018-01-15 NOTE — ED Provider Notes (Signed)
[facility] Emergency Department Provider Note   ____________________________________________   First MD Initiated Contact with Patient 01/15/18 1334     (approximate)  I have reviewed the triage vital signs and the nursing notes.   HISTORY  Chief Complaint Fall and Hip Pain    HPI Dustin Townsend is a 85 y.o. male patient arrived via EMS with complaint of left hip pain secondary to a fall.  Patient lost his balance while visiting his wife in the nursing home.  Patient denies LOC or vertigo.  Patient states pain increased with weightbearing.  Patient rates the pain as a 6/10.  Patient described the pain is "achy".  No palliative measures prior to arrival.  Patient also sustained a skin tear to left thumb.  Past Medical History:  Diagnosis Date  . AAA (abdominal aortic aneurysm) (HCC)   . CVA (cerebral vascular accident) (HCC)   . Hypertension     There are no active problems to display for this patient.   Past Surgical History:  Procedure Laterality Date  . ABDOMINAL AORTIC ANEURYSM REPAIR      Prior to Admission medications   Medication Sig Start Date End Date Taking? Authorizing Provider  amLODipine (NORVASC) 5 MG tablet Take 1 tablet (5 mg total) by mouth daily. 07/08/16   Sharman Cheek, MD    Allergies Patient has no known allergies.  No family history on file.  Social History Social History   Tobacco Use  . Smoking status: Current Every Day Smoker  . Smokeless tobacco: Never Used  Substance Use Topics  . Alcohol use: Not on file  . Drug use: Not on file    Review of Systems Constitutional: No fever/chills Eyes: No visual changes. ENT: No sore throat. Cardiovascular: Denies chest pain. Respiratory: Denies shortness of breath. Gastrointestinal: No abdominal pain.  No nausea, no vomiting.  No diarrhea.  No constipation. Genitourinary: Negative for dysuria. Musculoskeletal: Negative for back pain. Skin: Negative for rash.   Skin tear left thumb. Neurological: Negative for headaches, focal weakness or numbness. Endocrine:Hypertension.  ____________________________________________   PHYSICAL EXAM:  VITAL SIGNS: ED Triage Vitals  Enc Vitals Group     BP 01/15/18 1322 (!) 190/110     Pulse Rate 01/15/18 1322 80     Resp 01/15/18 1322 20     Temp 01/15/18 1322 98.4 F (36.9 C)     Temp Source 01/15/18 1322 Oral     SpO2 01/15/18 1322 97 %     Weight 01/15/18 1317 185 lb (83.9 kg)     Height 01/15/18 1317 5\' 11"  (1.803 m)     Head Circumference --      Peak Flow --      Pain Score 01/15/18 1317 6     Pain Loc --      Pain Edu? --      Excl. in GC? --     Constitutional: Alert and oriented.  Moderate distress.   Neck: No cervical spine tenderness to palpation. Cardiovascular: Normal rate, regular rhythm. Grossly normal heart sounds.  Good peripheral circulation.  Elevated blood pressure. Respiratory: Normal respiratory effort.  No retractions. Lungs CTAB. Musculoskeletal: No obvious leg length discrepancy.  Patient is moderate guarding palpation to greater trochanter.  Decreased range of motion is all feels limited by complaint of pain.  Neurologic:  Normal speech and language. No gross focal neurologic deficits are appreciated. No gait instability. Skin:  Skin is warm, dry and intact. No rash noted. Psychiatric: Mood  and affect are normal. Speech and behavior are normal.  ____________________________________________   LABS (all labs ordered are listed, but only abnormal results are displayed)  Labs Reviewed  CBC WITH DIFFERENTIAL/PLATELET  BASIC METABOLIC PANEL  TYPE AND SCREEN   ____________________________________________  EKG   ____________________________________________  RADIOLOGY  ED MD interpretation:    Official radiology report(s): Dg Chest 1 View  Result Date: 01/15/2018 CLINICAL DATA:  Loss of balance.  Fall. EXAM: CHEST  1 VIEW COMPARISON:  Chest x-ray 10/15/2009.  FINDINGS: Mediastinum hilar structures normal. Cardiomegaly. No pulmonary venous congestion. No focal infiltrate. No pleural effusion or pneumothorax. No acute bony abnormality identified. IMPRESSION: 1.  Cardiomegaly.  No pulmonary venous congestion. 2.  No acute pulmonary disease. Electronically Signed   By: Maisie Fus  Register   On: 01/15/2018 14:27   Dg Hip Unilat W Or Wo Pelvis 2-3 Views Left  Result Date: 01/15/2018 CLINICAL DATA:  Status post fall with left hip pain. EXAM: DG HIP (WITH OR WITHOUT PELVIS) 2-3V LEFT COMPARISON:  None. FINDINGS: There is an oblique mildly comminuted impacted fracture of the left distal femoral neck extending to the greater trochanter. There is mild superior displacement of the distal fracture fragment. Postsurgical changes in the pelvis noted. IMPRESSION: Oblique impacted intertrochanteric left femoral fracture. Electronically Signed   By: Ted Mcalpine M.D.   On: 01/15/2018 14:23    ____________________________________________   PROCEDURES  Procedure(s) performed: None  Procedures  Critical Care performed: No  ____________________________________________   INITIAL IMPRESSION / ASSESSMENT AND PLAN / ED COURSE  As part of my medical decision making, I reviewed the following data within the electronic MEDICAL RECORD NUMBER    Patient presents with left hip pain secondary to a fall.  X-ray shows trochanter fracture.  Discussed patient with attending, on-call orthopedic, and hospitalist.  Patient will be admitted for surgery.     ____________________________________________   FINAL CLINICAL IMPRESSION(S) / ED DIAGNOSES  Final diagnoses:  Closed left hip fracture, initial encounter [facility])     ED Discharge Orders    None       Note:  This document was prepared using Dragon voice recognition software and may include unintentional dictation errors.    Joni Reining, PA-C 01/15/18 1512    Minna Antis, MD 01/15/18 1520

## 2018-01-16 ENCOUNTER — Inpatient Hospital Stay: Payer: Medicare Other | Admitting: Anesthesiology

## 2018-01-16 ENCOUNTER — Inpatient Hospital Stay: Payer: Medicare Other

## 2018-01-16 ENCOUNTER — Encounter
Admission: EM | Disposition: A | Discharge status: Skilled Nursing Facility | Payer: Self-pay | Source: Home / Self Care | Attending: Internal Medicine

## 2018-01-16 HISTORY — PX: HIP ARTHROPLASTY: SHX981

## 2018-01-16 LAB — PROTIME-INR
INR: 1.01
Prothrombin Time: 13.2 seconds (ref 11.4–15.2)

## 2018-01-16 LAB — CBC
HCT: 39.7 % (ref 39.0–52.0)
Hemoglobin: 13.3 g/dL (ref 13.0–17.0)
MCH: 33.3 pg (ref 26.0–34.0)
MCHC: 33.5 g/dL (ref 30.0–36.0)
MCV: 99.5 fL (ref 80.0–100.0)
Platelets: 181 10*3/uL (ref 150–400)
RBC: 3.99 MIL/uL — ABNORMAL LOW (ref 4.22–5.81)
RDW: 13.2 % (ref 11.5–15.5)
WBC: 7.5 10*3/uL (ref 4.0–10.5)
nRBC: 0 % (ref 0.0–0.2)

## 2018-01-16 LAB — BASIC METABOLIC PANEL
Anion gap: 7 (ref 5–15)
BUN: 21 mg/dL (ref 8–23)
CO2: 23 mmol/L (ref 22–32)
Calcium: 8.4 mg/dL — ABNORMAL LOW (ref 8.9–10.3)
Chloride: 107 mmol/L (ref 98–111)
Creatinine, Ser: 0.77 mg/dL (ref 0.61–1.24)
GFR calc Af Amer: >60 mL/min (ref >60)
GFR calc non Af Amer: >60 mL/min (ref >60)
Glucose, Bld: 104 mg/dL — ABNORMAL HIGH (ref 70–99)
Potassium: 3.6 mmol/L (ref 3.5–5.1)
Sodium: 137 mmol/L (ref 135–145)

## 2018-01-16 SURGERY — HEMIARTHROPLASTY, HIP, DIRECT ANTERIOR APPROACH, FOR FRACTURE
Anesthesia: Spinal | Laterality: Left

## 2018-01-16 MED ORDER — SODIUM CHLORIDE 0.9 % IV SOLN
INTRAVENOUS | Status: DC
  Administered 2018-01-17 – 2018-01-18 (×3): via INTRAVENOUS

## 2018-01-16 MED ORDER — BUPIVACAINE LIPOSOME 1.3 % IJ SUSP
INTRAMUSCULAR | Status: DC | PRN
  Administered 2018-01-16: 50 mL

## 2018-01-16 MED ORDER — SODIUM CHLORIDE 0.9 % IR SOLN
Status: DC | PRN
  Administered 2018-01-16: 1000 mL

## 2018-01-16 MED ORDER — TRAMADOL HCL 50 MG PO TABS
50.0000 mg | ORAL_TABLET | Freq: Four times a day (QID) | ORAL | Status: DC | PRN
  Administered 2018-01-16 – 2018-01-19 (×6): 50 mg via ORAL
  Filled 2018-01-16 (×6): qty 1

## 2018-01-16 MED ORDER — PROPOFOL 500 MG/50ML IV EMUL
INTRAVENOUS | Status: AC
  Filled 2018-01-16: qty 50

## 2018-01-16 MED ORDER — MENTHOL 3 MG MT LOZG
1.0000 | LOZENGE | OROMUCOSAL | Status: DC | PRN
  Filled 2018-01-16: qty 9

## 2018-01-16 MED ORDER — GLYCOPYRROLATE 0.2 MG/ML IJ SOLN
INTRAMUSCULAR | Status: DC | PRN
  Administered 2018-01-16: 0.2 mg via INTRAVENOUS

## 2018-01-16 MED ORDER — OXYCODONE HCL 5 MG PO TABS
2.5000 mg | ORAL_TABLET | ORAL | Status: DC | PRN
  Administered 2018-01-16 – 2018-01-19 (×7): 5 mg via ORAL
  Filled 2018-01-16 (×7): qty 1

## 2018-01-16 MED ORDER — METOCLOPRAMIDE HCL 5 MG/ML IJ SOLN: 5.0000 mg | Freq: Three times a day (TID) | INTRAMUSCULAR | Status: DC | PRN

## 2018-01-16 MED ORDER — ENOXAPARIN SODIUM 40 MG/0.4ML ~~LOC~~ SOLN
40.0000 mg | SUBCUTANEOUS | Status: DC
  Administered 2018-01-17 – 2018-01-19 (×3): 40 mg via SUBCUTANEOUS
  Filled 2018-01-16 (×3): qty 0.4

## 2018-01-16 MED ORDER — FENTANYL CITRATE (PF) 100 MCG/2ML IJ SOLN: 25.0000 ug | INTRAMUSCULAR | Status: DC | PRN

## 2018-01-16 MED ORDER — BISACODYL 5 MG PO TBEC: 5.0000 mg | DELAYED_RELEASE_TABLET | Freq: Every day | ORAL | Status: DC | PRN

## 2018-01-16 MED ORDER — TRANEXAMIC ACID-NACL 1000-0.7 MG/100ML-% IV SOLN
1000.0000 mg | INTRAVENOUS | Status: DC
  Filled 2018-01-16: qty 100

## 2018-01-16 MED ORDER — PHENOL 1.4 % MT LIQD
1.0000 | OROMUCOSAL | Status: DC | PRN
  Filled 2018-01-16: qty 177

## 2018-01-16 MED ORDER — GLYCOPYRROLATE 0.2 MG/ML IJ SOLN
INTRAMUSCULAR | Status: AC
  Filled 2018-01-16: qty 1

## 2018-01-16 MED ORDER — ONDANSETRON HCL 4 MG/2ML IJ SOLN: 4.0000 mg | Freq: Once | INTRAMUSCULAR | Status: DC | PRN

## 2018-01-16 MED ORDER — LACTATED RINGERS IV SOLN
INTRAVENOUS | Status: DC
  Administered 2018-01-16 (×2): via INTRAVENOUS

## 2018-01-16 MED ORDER — ONDANSETRON HCL 4 MG PO TABS: 4.0000 mg | ORAL_TABLET | Freq: Four times a day (QID) | ORAL | Status: DC | PRN

## 2018-01-16 MED ORDER — BUPIVACAINE HCL (PF) 0.5 % IJ SOLN
INTRAMUSCULAR | Status: DC | PRN
  Administered 2018-01-16: 3 mL

## 2018-01-16 MED ORDER — MIDAZOLAM HCL 2 MG/2ML IJ SOLN
INTRAMUSCULAR | Status: AC
  Filled 2018-01-16: qty 2

## 2018-01-16 MED ORDER — ACETAMINOPHEN 10 MG/ML IV SOLN
INTRAVENOUS | Status: DC | PRN
  Administered 2018-01-16: 1000 mg via INTRAVENOUS

## 2018-01-16 MED ORDER — PROPOFOL 10 MG/ML IV BOLUS
INTRAVENOUS | Status: DC | PRN
  Administered 2018-01-16: 30 mg via INTRAVENOUS
  Administered 2018-01-16 (×3): 20 mg via INTRAVENOUS

## 2018-01-16 MED ORDER — BISACODYL 10 MG RE SUPP
10.0000 mg | Freq: Every day | RECTAL | Status: DC | PRN
  Administered 2018-01-18: 10 mg via RECTAL
  Filled 2018-01-16: qty 1

## 2018-01-16 MED ORDER — CEFAZOLIN SODIUM-DEXTROSE 2-4 GM/100ML-% IV SOLN
2.0000 g | Freq: Three times a day (TID) | INTRAVENOUS | Status: DC
  Administered 2018-01-16: 2 g via INTRAVENOUS
  Filled 2018-01-16 (×4): qty 100

## 2018-01-16 MED ORDER — METOCLOPRAMIDE HCL 10 MG PO TABS: 5.0000 mg | ORAL_TABLET | Freq: Three times a day (TID) | ORAL | Status: DC | PRN

## 2018-01-16 MED ORDER — DOCUSATE SODIUM 100 MG PO CAPS: 100.0000 mg | ORAL_CAPSULE | Freq: Two times a day (BID) | ORAL | Status: DC

## 2018-01-16 MED ORDER — MIDAZOLAM HCL 5 MG/5ML IJ SOLN
INTRAMUSCULAR | Status: DC | PRN
  Administered 2018-01-16: 2 mg via INTRAVENOUS

## 2018-01-16 MED ORDER — SENNOSIDES-DOCUSATE SODIUM 8.6-50 MG PO TABS: 1.0000 | ORAL_TABLET | Freq: Every evening | ORAL | Status: DC | PRN

## 2018-01-16 MED ORDER — CEFAZOLIN SODIUM-DEXTROSE 2-4 GM/100ML-% IV SOLN
2.0000 g | Freq: Four times a day (QID) | INTRAVENOUS | Status: AC
  Administered 2018-01-16 – 2018-01-17 (×2): 2 g via INTRAVENOUS
  Filled 2018-01-16 (×2): qty 100

## 2018-01-16 MED ORDER — ACETAMINOPHEN 10 MG/ML IV SOLN
INTRAVENOUS | Status: AC
  Filled 2018-01-16: qty 100

## 2018-01-16 MED ORDER — SODIUM CHLORIDE 0.9 % IV SOLN
INTRAVENOUS | Status: DC | PRN
  Administered 2018-01-16: 40 ug/min via INTRAVENOUS

## 2018-01-16 MED ORDER — PROPOFOL 500 MG/50ML IV EMUL
INTRAVENOUS | Status: DC | PRN
  Administered 2018-01-16: 65 ug/kg/min via INTRAVENOUS

## 2018-01-16 MED ORDER — DOCUSATE SODIUM 100 MG PO CAPS
100.0000 mg | ORAL_CAPSULE | Freq: Two times a day (BID) | ORAL | Status: DC
  Administered 2018-01-16 – 2018-01-18 (×5): 100 mg via ORAL
  Filled 2018-01-16 (×6): qty 1

## 2018-01-16 MED ORDER — ONDANSETRON HCL 4 MG/2ML IJ SOLN: 4.0000 mg | Freq: Four times a day (QID) | INTRAMUSCULAR | Status: DC | PRN

## 2018-01-16 SURGICAL SUPPLY — 66 items
BLADE SAGITTAL AGGR TOOTH XLG (BLADE) ×2 IMPLANT
BLADE SAGITTAL WIDE XTHICK NO (BLADE) ×3 IMPLANT
BLADE SAW SAG 29X58X.64 (BLADE) ×2 IMPLANT
BLADE SURG SZ10 CARB STEEL (BLADE) ×3 IMPLANT
BNDG COHESIVE 4X5 TAN STRL (GAUZE/BANDAGES/DRESSINGS) ×3 IMPLANT
CANISTER SUCT 1200ML W/VALVE (MISCELLANEOUS) ×3 IMPLANT
CANISTER SUCT 3000ML PPV (MISCELLANEOUS) ×6 IMPLANT
CHLORAPREP W/TINT 26ML (MISCELLANEOUS) ×3 IMPLANT
COVER WAND RF STERILE (DRAPES) ×1 IMPLANT
DERMABOND ADVANCED (GAUZE/BANDAGES/DRESSINGS) ×2
DERMABOND ADVANCED .7 DNX12 (GAUZE/BANDAGES/DRESSINGS) ×1 IMPLANT
DRAPE IMP U-DRAPE 54X76 (DRAPES) ×3 IMPLANT
DRAPE INCISE IOBAN 66X60 STRL (DRAPES) ×3 IMPLANT
DRAPE SHEET LG 3/4 BI-LAMINATE (DRAPES) ×6 IMPLANT
DRAPE SURG 17X11 SM STRL (DRAPES) ×3 IMPLANT
DRAPE TABLE BACK 80X90 (DRAPES) ×3 IMPLANT
DRSG OPSITE POSTOP 4X10 (GAUZE/BANDAGES/DRESSINGS) ×3 IMPLANT
DRSG OPSITE POSTOP 4X8 (GAUZE/BANDAGES/DRESSINGS) ×3 IMPLANT
ELECT BLADE 6.5 EXT (BLADE) ×1 IMPLANT
ELECT CAUTERY BLADE 6.4 (BLADE) ×1 IMPLANT
ELECT REM PT RETURN 9FT ADLT (ELECTROSURGICAL) ×3
ELECTRODE REM PT RTRN 9FT ADLT (ELECTROSURGICAL) ×1 IMPLANT
GAUZE PETRO XEROFOAM 1X8 (MISCELLANEOUS) ×3 IMPLANT
GAUZE SPONGE 4X4 12PLY STRL (GAUZE/BANDAGES/DRESSINGS) ×3 IMPLANT
GLOVE BIOGEL PI IND STRL 8 (GLOVE) ×2 IMPLANT
GLOVE BIOGEL PI INDICATOR 8 (GLOVE) ×4
GLOVE SURG ORTHO 8.0 STRL STRW (GLOVE) ×8 IMPLANT
GOWN STRL REUS W/ TWL LRG LVL3 (GOWN DISPOSABLE) ×1 IMPLANT
GOWN STRL REUS W/ TWL XL LVL3 (GOWN DISPOSABLE) ×1 IMPLANT
GOWN STRL REUS W/TWL LRG LVL3 (GOWN DISPOSABLE) ×2
GOWN STRL REUS W/TWL XL LVL3 (GOWN DISPOSABLE) ×2
HEAD MODULAR ENDO (Orthopedic Implant) ×2 IMPLANT
HEAD UNPLR 51XMDLR STRL HIP (Orthopedic Implant) IMPLANT
HEMOVAC 400ML (MISCELLANEOUS)
KIT DRAIN HEMOVAC JP 7FR 400ML (MISCELLANEOUS) IMPLANT
KIT TURNOVER KIT A (KITS) ×3 IMPLANT
NDL FILTER BLUNT 18X1 1/2 (NEEDLE) ×1 IMPLANT
NDL MAYO CATGUT SZ4 TPR NDL (NEEDLE) ×1 IMPLANT
NDL SAFETY ECLIPSE 18X1.5 (NEEDLE) ×1 IMPLANT
NEEDLE FILTER BLUNT 18X 1/2SAF (NEEDLE) ×2
NEEDLE FILTER BLUNT 18X1 1/2 (NEEDLE) ×1 IMPLANT
NEEDLE HYPO 18GX1.5 SHARP (NEEDLE) ×2
NEEDLE MAYO CATGUT SZ4 (NEEDLE) ×3 IMPLANT
NS IRRIG 1000ML POUR BTL (IV SOLUTION) ×3 IMPLANT
PACK HIP PROSTHESIS (MISCELLANEOUS) ×3 IMPLANT
PENCIL SMOKE EVACUATOR (MISCELLANEOUS) ×2 IMPLANT
PILLOW ABDUCTION FOAM SM (MISCELLANEOUS) ×5 IMPLANT
PULSAVAC PLUS IRRIG FAN TIP (DISPOSABLE) ×3
RETRIEVER SUT HEWSON (MISCELLANEOUS) IMPLANT
SLEEVE UNITRAX V40 STD (Orthopedic Implant) ×2 IMPLANT
SOL .9 NS 3000ML IRR  AL (IV SOLUTION) ×2
SOL .9 NS 3000ML IRR UROMATIC (IV SOLUTION) ×1 IMPLANT
STAPLER SKIN PROX 35W (STAPLE) ×3 IMPLANT
STEM ACCOLADE SZ 6 (Hips) ×2 IMPLANT
SUT ETHIBOND #5 BRAIDED 30INL (SUTURE) ×3 IMPLANT
SUT MNCRL 4-0 (SUTURE) ×2
SUT MNCRL 4-0 27XMFL (SUTURE) ×1
SUT VIC AB 0 CT1 36 (SUTURE) ×3 IMPLANT
SUT VIC AB 2-0 CT2 27 (SUTURE) ×6 IMPLANT
SUTURE MNCRL 4-0 27XMF (SUTURE) ×1 IMPLANT
SYR 20CC LL (SYRINGE) ×3 IMPLANT
TAPE MICROFOAM 4IN (TAPE) ×3 IMPLANT
TAPE TRANSPORE STRL 2 31045 (GAUZE/BANDAGES/DRESSINGS) ×3 IMPLANT
TIP BRUSH PULSAVAC PLUS 24.33 (MISCELLANEOUS) ×3 IMPLANT
TIP FAN IRRIG PULSAVAC PLUS (DISPOSABLE) ×1 IMPLANT
TUBE SUCT KAM VAC (TUBING) ×3 IMPLANT

## 2018-01-16 NOTE — Op Note (Signed)
DATE OF SURGERY: 01/16/2018  PREOPERATIVE DIAGNOSIS: Left femoral neck fracture  POSTOPERATIVE DIAGNOSIS: Left femoral neck fracture  PROCEDURE: left hip hemiarthroplasty  SURGEON: Rosealee AlbeeSunny H. Roald Lukacs, MD  ANESTHESIA: spinal  EBL: 200 cc  COMPONENTS:  Stryker - Accolade II Size 6 Stem Stryker - Unitrax 51mm head with standard offset neck   INDICATIONS: Oley BalmDavid Z Dercole is a 85 y.o. male who sustained a displaced femoral neck fracture after a fall. Risks and benefits of hip hemiarthroplasty were explained to the patient and/or family. Risks include but are not limited to bleeding, infection, injury to tissues, nerves, vessels, periprosthetic infection, dislocation, limb length discrepancy and risks of anesthesia. The patient and/or family understands these risks, has completed an informed consent and wishes to proceed.   PROCEDURE:  The patient was identified in the preoperative holding area and the operative extremity was marked.  The patient was then transferred to the operating room suite and mobilized from the hospital gurney to the operating room table. Anesthesia was administered without complication. The patient was then transitioned to a lateral position.  All bony prominences were padded per protocol.  An axillary roll was placed.  Careful attention was paid to the contralateral side peroneal nerve, which was free from pressure with use of appropriate padding and blankets. A time-out was performed to confirm the patient's identity and the correct laterality of surgery. The patient was then prepped and draped in the usual sterile fashion. Appropriate pre-operative antibiotics were administered.    An incision that centered on the posterior tip of the greater trochanter with a posterior curve was made. Dissection was carried down through the subcutaneous tissue.  Careful attention was made to maintain hemostasis using electrocautery.  Dissection brought us to the level of the deep fascia where  the gluteus maximus muscle and proximal portion of the IT band were identified.  The proximal region of the IT band was incised in linear fashion and this incision was extended proximally in a curvilinear fashion to split the gluteus maximus muscle parallel to its fibers to minimize bleeding.  This was accomplished using a combination of bovie electrocautery as well as blunt dissection.  The trochanteric bursa was then visualized and dissected from anterior to posterior. A blunt homan retractor was placed beneath the abductors. The piriformis tendon and short external rotators were visualized. Bovie electrocautery was used to cut these with the capsule as one L-shaped flap. This was tagged at the corner with #5 Ethibond. At this point, the femoral neck fracture was visualized. An oscillating saw was used to make a new neck cut approximately 15mm above the lesser tuberosity with the use of a neck cut guide. The head was then freed from its remaining soft tissue attachments and measured. The head trial was then inserted into the acetabulum and the appropriate sized head was selected.    We then turned our attention to preparing the femoral canal. First, a box cut was performed utilizing the box osteotome. A canal finder was inserted by hand and sequential broaching was then performed. The calcar planer was inserted onto the broach and used to smooth the calcar appropriately.  A trial stem, neutral neck, and head were inserted into the acetabulum and placed through range of motion. Intraoperative radiographs showed the operative leg to be equal in length to the contralateral leg with satisfactory position of the trial implants. The hip was again dislocated and the femoral trial components were removed.  The actual stem was inserted into the femoral canal  and then driven onto the calcar. The trial head was then again inserted on the femoral component and found to be appropriate. It was then removed and the permanent  head was Morse tapered onto the femoral stem and then reduced into the acetabulum.    The hip stability and length were reassessed and found to be satisfactory.  The wound was then copiously irrigated with normal saline solution. The tagged sutures of the capsule and piriformis were sewn to the gluteus medius tendon. This adequately closed the hip capsule. The IT band and gluteus maximus fascia were then closed with 0-Vicryl in a running, locked fashion. A mixture of Exparil and bupivicaine was administered.  The subdermal layer was closed with 2-0 Vicryl in a buried interrupted fashion. Skin was approximated with staples.  The wound was then covered with Xeroform and Honeycomb dressing.  An abduction pillow was placed. The patient was mobilized from the lateral position back to supine on the operating room table and then awakened from anesthesia without complication.  POSTOPERATIVE PLAN: The patient will be WBAT on operative extremity. Lovenox 40mg /day x 4 weeks to start on POD#1. Ancef x 24 hours. PT/OT on POD#1. Posterior hip precautions.

## 2018-01-16 NOTE — NC FL2 (Signed)
[place] MEDICAID FL2 LEVEL OF CARE SCREENING TOOL     IDENTIFICATION  Patient Name: Dustin Townsend Birthdate: 08/21/1933 Sex: male Admission Date (Current Location): 01/15/2018  [place]   Facility and Address:  [facility], [address], [place], [place] [address]      Provider Number: [id number]  Attending Physician Name and Address:  Shaune Pollackhen, Qing, MD  Relative Name and Phone Number:       Current Level of Care: Hospital Recommended Level of Care: Skilled Nursing Facility Prior Approval Number:    Date Approved/Denied:   PASRR Number: [id number])  Discharge Plan: SNF    Current Diagnoses: Patient Active Problem List   Diagnosis Date Noted  . Hip fracture (HCC) 01/15/2018    Orientation RESPIRATION BLADDER Height & Weight     Self, Time, Situation, Place  Normal Continent Weight: 185 lb (83.9 kg) Height:  5\' 11"  (180.3 cm)  BEHAVIORAL SYMPTOMS/MOOD NEUROLOGICAL BOWEL NUTRITION STATUS      Continent Diet(Diet: NPO for surgery to be advanced. )  AMBULATORY STATUS COMMUNICATION OF NEEDS Skin   Extensive Assist Verbally Surgical wounds(Incision: Left Hip. )                       Personal Care Assistance Level of Assistance  Bathing, Feeding, Dressing Bathing Assistance: Limited assistance Feeding assistance: Independent Dressing Assistance: Limited assistance     Functional Limitations Info  Sight, Hearing, Speech Sight Info: Adequate Hearing Info: Adequate Speech Info: Adequate    SPECIAL CARE FACTORS FREQUENCY  PT (By licensed PT), OT (By licensed OT)     PT Frequency: (5) OT Frequency: (5)            Contractures      Additional Factors Info  Code Status, Allergies Code Status Info: (Full Code. ) Allergies Info: (No Known Allergies. )           Current Medications (01/16/2018):  This is the current hospital active medication list Current Facility-Administered Medications   Medication Dose Route Frequency Provider Last Rate Last Dose  . 0.9 %  sodium chloride infusion   Intravenous Continuous Auburn BilberryPatel, Shreyang, MD 75 mL/hr at 01/16/18 0745    . [MAR Hold] acetaminophen (TYLENOL) tablet 650 mg  650 mg Oral Q6H PRN Auburn BilberryPatel, Shreyang, MD       Or  . Mitzi Hansen[MAR Hold] acetaminophen (TYLENOL) suppository 650 mg  650 mg Rectal Q6H PRN Auburn BilberryPatel, Shreyang, MD      . Mitzi Hansen[MAR Hold] bisacodyl (DULCOLAX) EC tablet 5 mg  5 mg Oral Daily PRN Shaune Pollackhen, Qing, MD      . Mitzi Hansen[MAR Hold] ceFAZolin (ANCEF) IVPB 2g/100 mL premix  2 g Intravenous Q8H Signa KellPatel, Sunny, MD   2 g at 01/16/18 1318  . [MAR Hold] docusate sodium (COLACE) capsule 100 mg  100 mg Oral BID Shaune Pollackhen, Qing, MD      . Mitzi Hansen[MAR Hold] dutasteride (AVODART) capsule 0.5 mg  0.5 mg Oral Daily Auburn BilberryPatel, Shreyang, MD      . fentaNYL (SUBLIMAZE) injection 25 mcg  25 mcg Intravenous Q5 min PRN Naomie DeanKephart, William K, MD      . glycopyrrolate (ROBINUL) 0.2 MG/ML injection           . [MAR Hold] hydrALAZINE (APRESOLINE) injection 10 mg  10 mg Intravenous Q6H PRN Auburn BilberryPatel, Shreyang, MD      . Mitzi Hansen[MAR Hold] hydrochlorothiazide (HYDRODIURIL) tablet 25 mg  25 mg Oral Daily Auburn BilberryPatel, Shreyang, MD  25 mg at 01/16/18 0856  . [MAR Hold] HYDROmorphone (DILAUDID) injection 1 mg  1 mg Intravenous Q3H PRN Auburn Bilberry, MD   1 mg at 01/16/18 0859  . lactated ringers infusion   Intravenous Continuous Naomie Dean, MD      . Mitzi Hansen Hold] lisinopril (PRINIVIL,ZESTRIL) tablet 20 mg  20 mg Oral Daily Auburn Bilberry, MD   20 mg at 01/16/18 0857  . [MAR Hold] ondansetron (ZOFRAN) tablet 4 mg  4 mg Oral Q6H PRN Auburn Bilberry, MD       Or  . Mitzi Hansen Hold] ondansetron (ZOFRAN) injection 4 mg  4 mg Intravenous Q6H PRN Auburn Bilberry, MD      . ondansetron (ZOFRAN) injection 4 mg  4 mg Intravenous Once PRN Naomie Dean, MD      . Mitzi Hansen Hold] pravastatin (PRAVACHOL) tablet 10 mg  10 mg Oral q1800 Auburn Bilberry, MD      . Mitzi Hansen Hold] traMADol Janean Sark) tablet 50 mg  50 mg Oral Q6H PRN  Auburn Bilberry, MD   50 mg at 01/15/18 1926     Discharge Medications: Please see discharge summary for a list of discharge medications.  Relevant Imaging Results:  Relevant Lab Results:   Additional Information (SSN: [id number])  Neamiah Sciarra, Darleen Crocker, LCSW

## 2018-01-16 NOTE — Progress Notes (Signed)
[facility] [employer] -  at [facility]   PATIENT NAME: Dustin Townsend    MR#:  [id number]  DATE OF BIRTH:  06-01-1933  SUBJECTIVE:  CHIEF COMPLAINT:   Chief Complaint  Patient presents with  . Fall  . Hip Pain   Left hip pain. REVIEW OF SYSTEMS:  Review of Systems  Constitutional: Negative for chills, fever and malaise/fatigue.  HENT: Negative for sore throat.   Eyes: Negative for blurred vision and double vision.  Respiratory: Negative for cough, hemoptysis, shortness of breath, wheezing and stridor.   Cardiovascular: Negative for chest pain, palpitations, orthopnea and leg swelling.  Gastrointestinal: Negative for abdominal pain, blood in stool, diarrhea, melena, nausea and vomiting.  Genitourinary: Negative for dysuria, flank pain and hematuria.  Musculoskeletal: Positive for joint pain. Negative for back pain.  Skin: Negative for rash.  Neurological: Negative for dizziness, sensory change, focal weakness, seizures, loss of consciousness, weakness and headaches.  Endo/Heme/Allergies: Negative for polydipsia.  Psychiatric/Behavioral: Negative for depression. The patient is not nervous/anxious.     DRUG ALLERGIES:  No Known Allergies VITALS:  Blood pressure (!) 160/93, pulse 73, temperature 98 F (36.7 C), temperature source Oral, resp. rate 18, height 5\' 11"  (1.803 m), weight 83.9 kg, SpO2 96 %. PHYSICAL EXAMINATION:  Physical Exam Constitutional:      General: He is not in acute distress. HENT:     Head: Normocephalic.     Mouth/Throat:     Mouth: Mucous membranes are moist.  Eyes:     General: No scleral icterus.    Conjunctiva/sclera: Conjunctivae normal.     Pupils: Pupils are equal, round, and reactive to light.  Neck:     Musculoskeletal: Normal range of motion and neck supple.     Vascular: No JVD.     Trachea: No tracheal deviation.  Cardiovascular:     Rate and Rhythm: Normal rate and regular rhythm.     Heart sounds: Normal heart  sounds. No murmur. No gallop.   Pulmonary:     Effort: Pulmonary effort is normal. No respiratory distress.     Breath sounds: Normal breath sounds. No stridor. No wheezing, rhonchi or rales.  Abdominal:     General: Bowel sounds are normal. There is no distension.     Palpations: Abdomen is soft.     Tenderness: There is no abdominal tenderness. There is no rebound.  Musculoskeletal:        General: No tenderness.     Right lower leg: No edema.     Left lower leg: No edema.     Comments: Unable to move left leg.  Skin:    Findings: No erythema or rash.  Neurological:     General: No focal deficit present.     Mental Status: He is alert and oriented to person, place, and time.     Cranial Nerves: No cranial nerve deficit.  Psychiatric:        Mood and Affect: Mood normal.    LABORATORY PANEL:  Male CBC Recent Labs  Lab 01/16/18 0421  WBC 7.5  HGB 13.3  HCT 39.7  PLT 181   ------------------------------------------------------------------------------------------------------------------ Chemistries  Recent Labs  Lab 01/16/18 0421  NA 137  K 3.6  CL 107  CO2 23  GLUCOSE 104*  BUN 21  CREATININE 0.77  CALCIUM 8.4*   RADIOLOGY:  Dg Chest 1 View  Result Date: 01/15/2018 CLINICAL DATA:  Loss of balance.  Fall. EXAM: CHEST  1 VIEW COMPARISON:  Chest x-ray  10/15/2009. FINDINGS: Mediastinum hilar structures normal. Cardiomegaly. No pulmonary venous congestion. No focal infiltrate. No pleural effusion or pneumothorax. No acute bony abnormality identified. IMPRESSION: 1.  Cardiomegaly.  No pulmonary venous congestion. 2.  No acute pulmonary disease. Electronically Signed   By: Maisie Fus  Register   On: 01/15/2018 14:27   Ct Hip Left Wo Contrast  Result Date: 01/15/2018 CLINICAL DATA:  Fracture of the proximal left femur secondary to a fall. EXAM: CT OF THE LEFT HIP WITHOUT CONTRAST TECHNIQUE: Multidetector CT imaging of the left hip was performed according to the standard  protocol. Multiplanar CT image reconstructions were also generated. COMPARISON:  Radiographs dated 01/15/2018 FINDINGS: Bones/Joint/Cartilage There is a minimally comminuted fracture of the neck of the proximal left femur with slight angulation and impaction. Fracture does not involve the trochanters. There is no dislocation. Soft tissues Atherosclerosis.  No acute soft tissue abnormalities. IMPRESSION: Slightly angulated slightly impacted left femoral neck fracture. Electronically Signed   By: Francene Boyers M.D.   On: 01/15/2018 15:54   Dg Hip Unilat W Or Wo Pelvis 2-3 Views Left  Result Date: 01/15/2018 CLINICAL DATA:  Status post fall with left hip pain. EXAM: DG HIP (WITH OR WITHOUT PELVIS) 2-3V LEFT COMPARISON:  None. FINDINGS: There is an oblique mildly comminuted impacted fracture of the left distal femoral neck extending to the greater trochanter. There is mild superior displacement of the distal fracture fragment. Postsurgical changes in the pelvis noted. IMPRESSION: Oblique impacted intertrochanteric left femoral fracture. Electronically Signed   By: Ted Mcalpine M.D.   On: 01/15/2018 14:23   ASSESSMENT AND PLAN:   Patient is 85 year old with history of hypertension hyperlipidemia presenting with fall  1.  Left femoral fracture. Surgery today by Dr. Allena Katz. Pain control, DVT prophylaxis and PT after surgery.  2 accelerated hypertension, blood pressure is controlled. Continue lisinopril HCTZ, IV hydralazine PRN.  3.  Hyperlipidemia continue Pravachol  4.    Tobacco abuse.  Smoking cessation was counseled for 3 to 4 minutes.  All the records are reviewed and case discussed with Care Management/Social Worker. Management plans discussed with the patient, his 2 sons and they are in agreement.  CODE STATUS: Full Code  TOTAL TIME TAKING CARE OF THIS PATIENT:  30 minutes.   More than 50% of the time was spent in counseling/coordination of care: YES  POSSIBLE D/C IN 2 DAYS,  DEPENDING ON CLINICAL CONDITION.   Shaune Pollack M.D on 01/16/2018 at 2:16 PM  Between 7am to 6pm - Pager - [phone]  After 6pm go to [email or web address] - Therapist, nutritional Hospitalists

## 2018-01-16 NOTE — H&P (Signed)
H&P Reviewed. No significant changes.

## 2018-01-16 NOTE — Clinical Social Work Placement (Signed)
   CLINICAL SOCIAL WORK PLACEMENT  NOTE  Date:  01/16/2018  Patient Details  Name: Dustin Townsend MRN: [id number] Date of Birth: 01/14/1933  Clinical Social Work is seeking post-discharge placement for this patient at the Skilled  Nursing Facility level of care (*CSW will initial, date and re-position this form in  chart as items are completed):  Yes   Patient/family provided with [facility] Clinical Social Work Department's list of facilities offering this level of care within the geographic area requested by the patient (or if unable, by the patient's family).  Yes   Patient/family informed of their freedom to choose among providers that offer the needed level of care, that participate in Medicare, Medicaid or managed care program needed by the patient, have an available bed and are willing to accept the patient.  Yes   Patient/family informed of [facility]'s ownership interest in [facility] and [facility], as well as of the fact that they are under no obligation to receive care at these facilities.  PASRR submitted to EDS on 01/16/18     PASRR number received on 01/16/18     Existing PASRR number confirmed on       FL2 transmitted to all facilities in geographic area requested by pt/family on 01/16/18     FL2 transmitted to all facilities within larger geographic area on       Patient informed that his/her managed care company has contracts with or will negotiate with certain facilities, including the following:            Patient/family informed of bed offers received.  Patient chooses bed at       Physician recommends and patient chooses bed at      Patient to be transferred to   on  .  Patient to be transferred to facility by       Patient family notified on   of transfer.  Name of family member notified:        PHYSICIAN       Additional Comment:    _______________________________________________ Belle Charlie, Darleen CrockerBailey M, LCSW 01/16/2018, 4:48 PM

## 2018-01-16 NOTE — Anesthesia Preprocedure Evaluation (Signed)
Anesthesia Evaluation  Patient identified by MRN, date of birth, ID band Patient awake    Reviewed: Allergy & Precautions, NPO status , Patient's Chart, lab work & pertinent test results  History of Anesthesia Complications Negative for: history of anesthetic complications  Airway Mallampati: II       Dental   Pulmonary neg sleep apnea, neg COPD, Current Smoker,           Cardiovascular hypertension, Pt. on medications (-) Past MI and (-) CHF (-) dysrhythmias (-) Valvular Problems/Murmurs     Neuro/Psych neg Seizures CVA (R hand weakness, very slight R leg weakness), Residual Symptoms    GI/Hepatic Neg liver ROS, GERD  Medicated and Controlled,  Endo/Other  neg diabetes  Renal/GU negative Renal ROS     Musculoskeletal   Abdominal   Peds  Hematology   Anesthesia Other Findings   Reproductive/Obstetrics                             Anesthesia Physical Anesthesia Plan  ASA: III  Anesthesia Plan: Spinal   Post-op Pain Management:    Induction:   PONV Risk Score and Plan:   Airway Management Planned: Nasal Cannula  Additional Equipment:   Intra-op Plan:   Post-operative Plan:   Informed Consent: I have reviewed the patients History and Physical, chart, labs and discussed the procedure including the risks, benefits and alternatives for the proposed anesthesia with the patient or authorized representative who has indicated his/her understanding and acceptance.       Plan Discussed with:   Anesthesia Plan Comments:         Anesthesia Quick Evaluation

## 2018-01-16 NOTE — Anesthesia Post-op Follow-up Note (Signed)
Anesthesia QCDR form completed.        

## 2018-01-16 NOTE — Clinical Social Work Note (Signed)
Clinical Social Work Assessment  Patient Details  Name: Dustin Townsend MRN: [id number] Date of Birth: 04/07/1933  Date of referral:  01/16/18               Reason for consult:  Facility Placement                Permission sought to share information with:  Chartered certified accountant granted to share information::  Yes, Verbal Permission Granted  Name::        Agency::     Relationship::     Contact Information:     Housing/Transportation Living arrangements for the past 2 months:  Single Family Home Source of Information:  Patient, Spouse Patient Interpreter Needed:  None Criminal Activity/Legal Involvement Pertinent to Current Situation/Hospitalization:  No - Comment as needed Significant Relationships:  Spouse Lives with:  Spouse Do you feel safe going back to the place where you live?  Yes Need for family participation in patient care:  Yes (Comment)  Care giving concerns:  Patient lives in [place] with his wife Dustin Townsend.    Social Worker assessment / plan:  Holiday representative ([place]) reviewed chart and noted that patient has a hip fracture. CSW met with patient prior to surgery today. Surgery and PT are pending. Patient was alert and oriented X4 and was laying in the bed. CSW introduced self and explained role of CSW department. Per patient his wife is at [facility] for short term rehab. CSW explained that after surgery PT will evaluate patient and make a recommendation of home health or SNF. CSW explained that [facility] will have to approve SNF. Patient reported that he is agreeable to SNF if needed and prefers [facility]. FL2 complete and faxed out. CSW will continue to follow and assist as needed.   Employment status:  Retired Nurse, adult PT Recommendations:  Not assessed at this time Information / Referral to community resources:  [place]  Patient/Family's Response to care:  Patient is agreeable to SNF if needed.    Patient/Family's Understanding of and Emotional Response to Diagnosis, Current Treatment, and Prognosis:  Patient was very pleasant and thanked CSW for assistance.   Emotional Assessment Appearance:  Appears stated age Attitude/Demeanor/Rapport:    Affect (typically observed):  Accepting, Adaptable, Pleasant Orientation:  Oriented to Self, Oriented to Place, Oriented to  Time, Oriented to Situation Alcohol / Substance use:  Not Applicable Psych involvement (Current and /or in the community):  No (Comment)  Discharge Needs  Concerns to be addressed:  Discharge Planning Concerns Readmission within the last 30 days:  No Current discharge risk:  Dependent with Mobility Barriers to Discharge:  Continued Medical Work up   [employer], Veronia Beets, LCSW 01/16/2018, 4:50 PM

## 2018-01-16 NOTE — Transfer of Care (Signed)
Immediate Anesthesia Transfer of Care Note  Patient: Oley Balm  Procedure(s) Performed: ARTHROPLASTY BIPOLAR HIP (HEMIARTHROPLASTY) (Left )  Patient Location: PACU  Anesthesia Type:Spinal  Level of Consciousness: sedated  Airway & Oxygen Therapy: Patient Spontanous Breathing and Patient connected to face mask oxygen  Post-op Assessment: Report given to RN and Post -op Vital signs reviewed and stable  Post vital signs: Reviewed and stable  Last Vitals:  Vitals Value Taken Time  BP    Temp    Pulse    Resp 10 01/16/2018  3:56 PM  SpO2    Vitals shown include unvalidated device data.  Last Pain:  Vitals:   01/16/18 0929  TempSrc:   PainSc: 1       Patients Stated Pain Goal: 5 (01/15/18 1721)  Complications: No apparent anesthesia complications

## 2018-01-16 NOTE — Anesthesia Procedure Notes (Addendum)
Spinal  Patient location during procedure: OR Start time: 01/16/2018 1:00 PM End time: 01/16/2018 1:08 PM Staffing Resident/CRNA: Nelda Marseille, CRNA Performed: resident/CRNA  Preanesthetic Checklist Completed: patient identified, site marked, surgical consent, pre-op evaluation, timeout performed, IV checked, risks and benefits discussed and monitors and equipment checked Spinal Block Patient position: sitting Prep: Betadine Patient monitoring: heart rate, continuous pulse ox, blood pressure and cardiac monitor Approach: midline Location: L3-4 Injection technique: single-shot Needle Needle type: Whitacre and Introducer  Needle gauge: 25 G Needle length: 9 cm Assessment Sensory level: T10 Additional Notes Negative paresthesia. Negative blood return. Positive free-flowing CSF. Expiration date of kit checked and confirmed. Patient tolerated procedure well, without complications.

## 2018-01-17 ENCOUNTER — Encounter: Payer: Self-pay | Admitting: Orthopedic Surgery

## 2018-01-17 ENCOUNTER — Inpatient Hospital Stay: Payer: Medicare Other

## 2018-01-17 LAB — BASIC METABOLIC PANEL
Anion gap: 6 (ref 5–15)
BUN: 15 mg/dL (ref 8–23)
CALCIUM: 8.1 mg/dL — AB (ref 8.9–10.3)
CO2: 24 mmol/L (ref 22–32)
Chloride: 104 mmol/L (ref 98–111)
Creatinine, Ser: 0.76 mg/dL (ref 0.61–1.24)
GFR calc Af Amer: >60 mL/min (ref >60)
GFR calc non Af Amer: >60 mL/min (ref >60)
Glucose, Bld: 123 mg/dL — ABNORMAL HIGH (ref 70–99)
Potassium: 3.5 mmol/L (ref 3.5–5.1)
Sodium: 134 mmol/L — ABNORMAL LOW (ref 135–145)

## 2018-01-17 LAB — CBC
HCT: 37.2 % — ABNORMAL LOW (ref 39.0–52.0)
Hemoglobin: 12.5 g/dL — ABNORMAL LOW (ref 13.0–17.0)
MCH: 33.6 pg (ref 26.0–34.0)
MCHC: 33.6 g/dL (ref 30.0–36.0)
MCV: 100 fL (ref 80.0–100.0)
NRBC: 0 % (ref 0.0–0.2)
Platelets: 152 10*3/uL (ref 150–400)
RBC: 3.72 MIL/uL — ABNORMAL LOW (ref 4.22–5.81)
RDW: 12.8 % (ref 11.5–15.5)
WBC: 7.1 10*3/uL (ref 4.0–10.5)

## 2018-01-17 NOTE — Progress Notes (Signed)
Clinical Social Worker (CSW) presented bed offers to patient and he chose [facility]. [facility] Medicare SNF authorization has been started through [employer]. CSW explained how to apply for long term care medicaid to patient's son Tawanna Cooler and his wife. Tawanna Cooler and his wife are working on long term Loss adjuster, chartered for patient's wife. [facility] liaison is aware of above.   [employer], LCSW [phone]

## 2018-01-17 NOTE — Progress Notes (Signed)
OT Cancellation Note  Patient Details Name: RONTEZ PHILLIPPI MRN: [id number] DOB: 31-May-1933   Cancelled Treatment:    Reason Eval/Treat Not Completed: Pain limiting ability to participate;Other (comment). Consult received, chart reviewed. Spoke with PT who recently evaluated the pt. Per PT, pt experiencing significant LUE pain. PT spoke with RN to address. X-ray ordered. Will hold OT evaluation this am and re-attempt at later date/time once LUE has been assessed to ensure no movement restrictions/precautions prior to functional ADL activity.  Richrd Prime, MPH, MS, OTR/L ascom [phone] 01/17/18, 11:15 AM

## 2018-01-17 NOTE — Progress Notes (Signed)
OT Cancellation Note  Patient Details Name: JETSON FUDALA MRN: [id number] DOB: May 24, 1933   Cancelled Treatment:    Reason Eval/Treat Not Completed: Other (comment). Consult received, chart reviewed. Pt working with PT this afternoon upon 2nd attempt to complete OT evaluation. Will re-attempt next date.   Richrd Prime, MPH, MS, OTR/L ascom [phone] 01/17/18, 3:26 PM

## 2018-01-17 NOTE — Progress Notes (Signed)
Chaplain responded to a request from RN to answer question by Pt family. Family wanted AD executed and Chaplain secured witness and notary and completed AD.    01/17/18 1000  Clinical Encounter Type  Visited With Patient and family together  Visit Type Follow-up  Referral From Nurse  Spiritual Encounters  Spiritual Needs Brochure

## 2018-01-17 NOTE — Progress Notes (Signed)
  Subjective: 1 Day Post-Op Procedure(s) (LRB): ARTHROPLASTY BIPOLAR HIP (HEMIARTHROPLASTY) (Left) Patient reports pain as 5 on 0-10 scale.   Patient is well, and has had no acute complaints or problems PT and Care management to assist with discharge planning. Negative for chest pain and shortness of breath Fever: no Gastrointestinal:Negative for nausea and vomiting  Objective: Vital signs in last 24 hours: Temp:  [97.4 F (36.3 C)-98.7 F (37.1 C)] 97.7 F (36.5 C) (01/15 0730) Pulse Rate:  [55-86] 86 (01/15 0730) Resp:  [9-19] 18 (01/15 0730) BP: (114-165)/(69-96) 138/85 (01/15 0730) SpO2:  [94 %-100 %] 98 % (01/15 0730)  Intake/Output from previous day:  Intake/Output Summary (Last 24 hours) at 01/17/2018 1323 Last data filed at 01/17/2018 0338 Gross per 24 hour  Intake 2215 ml  Output 2950 ml  Net -735 ml    Intake/Output this shift: No intake/output data recorded.  Labs: Recent Labs    01/15/18 1504 01/16/18 0421 01/17/18 0325  HGB 14.3 13.3 12.5*   Recent Labs    01/16/18 0421 01/17/18 0325  WBC 7.5 7.1  RBC 3.99* 3.72*  HCT 39.7 37.2*  PLT 181 152   Recent Labs    01/16/18 0421 01/17/18 0325  NA 137 134*  K 3.6 3.5  CL 107 104  CO2 23 24  BUN 21 15  CREATININE 0.77 0.76  GLUCOSE 104* 123*  CALCIUM 8.4* 8.1*   Recent Labs    01/16/18 0421  INR 1.01     EXAM General - Patient is Alert, Appropriate and Oriented Extremity - ABD soft Sensation intact distally Intact pulses distally Dorsiflexion/Plantar flexion intact Incision: dressing C/D/I No cellulitis present Dressing/Incision - clean, dry, no drainage Motor Function - intact, moving foot and toes well on exam.  Abdomen soft with normal BS.  Past Medical History:  Diagnosis Date  . AAA (abdominal aortic aneurysm) (HCC)   . CVA (cerebral vascular accident) (HCC)   . Diverticulitis   . GERD (gastroesophageal reflux disease)   . Hyperlipemia   . Hypertension      Assessment/Plan: 1 Day Post-Op Procedure(s) (LRB): ARTHROPLASTY BIPOLAR HIP (HEMIARTHROPLASTY) (Left) Active Problems:   Hip fracture (HCC)  Estimated body mass index is 25.8 kg/m as calculated from the following:   Height as of this encounter: 5\' 11"  (1.803 m).   Weight as of this encounter: 83.9 kg. Advance diet Up with therapy D/C IV fluids when tolerating po intake.  Labs reviewed this AM, Hg 12.5 this AM. Up with therapy today. Begin working on having a BM. Will likely need SNF upon discharge.  DVT Prophylaxis - Lovenox, Foot Pumps and TED hose Weight-Bearing as tolerated to left leg  J. Horris LatinoLance McGhee, PA-C [facility] 01/17/2018, 1:23 PM

## 2018-01-17 NOTE — Progress Notes (Signed)
[employer] - [place] at [facility]   PATIENT NAME: Dustin Townsend    MR#:  [id number]  DATE OF BIRTH:  May 19, 1933  SUBJECTIVE:  CHIEF COMPLAINT:   Chief Complaint  Patient presents with  . Fall  . Hip Pain   The patient has better left hip pain. REVIEW OF SYSTEMS:  Review of Systems  Constitutional: Negative for chills, fever and malaise/fatigue.  HENT: Negative for sore throat.   Eyes: Negative for blurred vision and double vision.  Respiratory: Negative for cough, hemoptysis, shortness of breath, wheezing and stridor.   Cardiovascular: Negative for chest pain, palpitations, orthopnea and leg swelling.  Gastrointestinal: Negative for abdominal pain, blood in stool, diarrhea, melena, nausea and vomiting.  Genitourinary: Negative for dysuria, flank pain and hematuria.  Musculoskeletal: Positive for joint pain. Negative for back pain.  Skin: Negative for rash.  Neurological: Negative for dizziness, sensory change, focal weakness, seizures, loss of consciousness, weakness and headaches.  Endo/Heme/Allergies: Negative for polydipsia.  Psychiatric/Behavioral: Negative for depression. The patient is not nervous/anxious.     DRUG ALLERGIES:  No Known Allergies VITALS:  Blood pressure 138/85, pulse 86, temperature 97.7 F (36.5 C), temperature source Oral, resp. rate 18, height 5\' 11"  (1.803 m), weight 83.9 kg, SpO2 98 %. PHYSICAL EXAMINATION:  Physical Exam Constitutional:      General: He is not in acute distress. HENT:     Head: Normocephalic.     Mouth/Throat:     Mouth: Mucous membranes are moist.  Eyes:     General: No scleral icterus.    Conjunctiva/sclera: Conjunctivae normal.     Pupils: Pupils are equal, round, and reactive to light.  Neck:     Musculoskeletal: Normal range of motion and neck supple.     Vascular: No JVD.     Trachea: No tracheal deviation.  Cardiovascular:     Rate and Rhythm: Normal rate and regular rhythm.     Heart  sounds: Normal heart sounds. No murmur. No gallop.   Pulmonary:     Effort: Pulmonary effort is normal. No respiratory distress.     Breath sounds: Normal breath sounds. No stridor. No wheezing, rhonchi or rales.  Abdominal:     General: Bowel sounds are normal. There is no distension.     Palpations: Abdomen is soft.     Tenderness: There is no abdominal tenderness. There is no rebound.  Musculoskeletal:        General: No tenderness.     Right lower leg: No edema.     Left lower leg: No edema.     Comments: Left leg on fixation.  Skin:    Findings: No erythema or rash.  Neurological:     General: No focal deficit present.     Mental Status: He is alert and oriented to person, place, and time.     Cranial Nerves: No cranial nerve deficit.  Psychiatric:        Mood and Affect: Mood normal.    LABORATORY PANEL:  Male CBC Recent Labs  Lab 01/17/18 0325  WBC 7.1  HGB 12.5*  HCT 37.2*  PLT 152   ------------------------------------------------------------------------------------------------------------------ Chemistries  Recent Labs  Lab 01/17/18 0325  NA 134*  K 3.5  CL 104  CO2 24  GLUCOSE 123*  BUN 15  CREATININE 0.76  CALCIUM 8.1*   RADIOLOGY:  Dg Pelvis Portable  Result Date: 01/16/2018 CLINICAL DATA:  Status post left hip hemiarthroplasty. EXAM: PORTABLE PELVIS 1-2 VIEWS COMPARISON:  Radiographs  of same day. FINDINGS: Left femoral prosthesis appears to be well situated. Expected postoperative changes are noted in the surrounding soft tissues. No fracture or dislocation is noted. IMPRESSION: Status post left hip hemiarthroplasty. Electronically Signed   By: Lupita Raider, M.D.   On: 01/16/2018 16:11   Dg Pelvis Portable  Result Date: 01/16/2018 CLINICAL DATA:  Left hip hemiarthroplasty. EXAM: PORTABLE PELVIS 1-2 VIEWS COMPARISON:  CT left hip from yesterday. FINDINGS: Single frontal intraoperative x-ray demonstrates interval placement of a left femoral stem.  The femoral head component has not yet been placed. No acute abnormality. Expected intra-articular air. The right hip joint is unremarkable. IMPRESSION: Left femoral stem placement for left hip hemiarthroplasty. Electronically Signed   By: Obie Dredge M.D.   On: 01/16/2018 15:08   Dg Pelvis Portable  Result Date: 01/16/2018 CLINICAL DATA:  Left hip hemiarthroplasty. EXAM: PORTABLE PELVIS 1-2 VIEWS COMPARISON:  CT left hip from yesterday. FINDINGS: Single frontal intraoperative x-ray demonstrates interval placement of a left femoral stem. The femoral head component has not yet been placed. No acute abnormality. Expected intra-articular air. The right hip joint is unremarkable. IMPRESSION: Left femoral stem placement for left hip hemiarthroplasty. Electronically Signed   By: Obie Dredge M.D.   On: 01/16/2018 15:08   Dg Shoulder Left  Result Date: 01/17/2018 CLINICAL DATA:  Left shoulder pain after fall. EXAM: LEFT SHOULDER - 2+ VIEW COMPARISON:  None. FINDINGS: No acute fracture or dislocation. Moderate acromioclavicular joint space narrowing. The glenohumeral joint space is preserved. Tiny globular calcification along the posterior greater tuberosity. Soft tissues are unremarkable. The visualized left lung is clear. IMPRESSION: 1.  No acute osseous abnormality. 2. Moderate acromioclavicular osteoarthritis. 3. Tiny globular calcification along the posterior greater tuberosity could reflect infraspinatus or teres minor calcific tendinitis. Electronically Signed   By: Obie Dredge M.D.   On: 01/17/2018 10:22   ASSESSMENT AND PLAN:   Patient is 85 year old with history of hypertension hyperlipidemia presenting with fall  1.  Left femoral fracture. S/p left hip surgery POD 1. Pain control, DVT prophylaxis and PT. constipation medication PRN.  2 accelerated hypertension, blood pressure is controlled. Continue lisinopril HCTZ, IV hydralazine PRN.  3.  Hyperlipidemia continue  Pravachol  4.    Tobacco abuse.  Smoking cessation was counseled for 3 to 4 minutes.  Leukocytosis, possible due to reaction secondary to surgery.  Follow-up CBC.  All the records are reviewed and case discussed with Care Management/Social Worker. Management plans discussed with the patient, his 2 sons and they are in agreement.  CODE STATUS: Full Code  TOTAL TIME TAKING CARE OF THIS PATIENT:  25 minutes.   More than 50% of the time was spent in counseling/coordination of care: YES  POSSIBLE D/C IN 1-2 DAYS, DEPENDING ON CLINICAL CONDITION.   Shaune Pollack M.D on 01/17/2018 at 2:11 PM  Between 7am to 6pm - Pager - [phone]  After 6pm go to [email or web address] - Therapist, [facility] Hospitalists

## 2018-01-17 NOTE — Progress Notes (Signed)
Physical Therapy Treatment Patient Details Name: Dustin Townsend MRN: [id number] DOB: [birth year] Today's Date: 01/17/2018    History of Present Illness Pt is an 85 y.o. male with a known history of abdominal aortic aneurysm, previous CVA, diverticulitis, GERD, hyperlipidemia and hypertension who is presenting to the hospital with fall.  Patient stated that he was throwing a balloon on the wall with his wife who is in rehab.  Patient fell.  Pt diagnosed with a left femoral neck fracture and is s/p L hip hemiarthroplasty.  MD assessment also includes HTN and HLD.      PT Comments    Pt presents with deficits in strength, transfers, mobility, gait, balance, and activity tolerance.  Pt declined all but supine therex this session secondary to L hip pain, "I just can't take it right now".  Pt participated with below therex with slow, low amplitude movements on the LLE and with frequent short therapeutic rest breaks.  Pt will benefit from PT services in a SNF setting upon discharge to safely address above deficits for decreased caregiver assistance and eventual return to PLOF.     Follow Up Recommendations  SNF;Supervision for mobility/OOB     Equipment Recommendations  None recommended by PT    Recommendations for Other Services       Precautions / Restrictions Precautions Precautions: Fall;Posterior Hip Precaution Booklet Issued: Yes (comment) Precaution Comments: Hip precaution education provided to pt and family Restrictions Weight Bearing Restrictions: Yes LLE Weight Bearing: Weight bearing as tolerated    Mobility  Bed Mobility Overal bed mobility: Needs Assistance Bed Mobility: Supine to Sit;Sit to Supine     Supine to sit: +2 for physical assistance;Max assist Sit to supine: +2 for physical assistance;Max assist   General bed mobility comments: Pt declined all but supine therex this session secondary to L hip pain with movement  Transfers Overall transfer level: Needs  assistance Equipment used: Rolling walker (2 wheeled) Transfers: Sit to/from Stand Sit to Stand: From elevated surface         General transfer comment: NT  Ambulation/Gait             General Gait Details: Unable   Stairs             Wheelchair Mobility    Modified Rankin (Stroke Patients Only)       Balance Overall balance assessment: Needs assistance   Sitting balance-Leahy Scale: Good         Standing balance comment: Unable to stand                            Cognition Arousal/Alertness: Awake/alert Behavior During Therapy: WFL for tasks assessed/performed Overall Cognitive Status: Within Functional Limits for tasks assessed                                        Exercises Total Joint Exercises Ankle Circles/Pumps: AROM;Both;10 reps;Strengthening;15 reps Quad Sets: Strengthening;Both;10 reps Gluteal Sets: Strengthening;Both;10 reps Towel Squeeze: Strengthening;Both;10 reps Short Arc Quad: Strengthening;Both;10 reps;15 reps;Other (comment)(Manual resistance on the RLE) Heel Slides: AROM;AAROM;Both;10 reps;Other (comment)(AAROM with small amplitude on the LLE) Hip ABduction/ADduction: AROM;AAROM;Both;10 reps;Other (comment)(AAROM with small amplitude on the LLE) Straight Leg Raises: AROM;AAROM;Both;10 reps;Other (comment)(AAROM with small amplitude on the LLE) Long Arc Quad: AROM;Both;10 reps;15 reps Knee Flexion: AROM;Both;10 reps;15 reps Other Exercises Other Exercises: Posterior hip precaution education/review with  pt and family  Other Exercises: Physiological benefits of activity education provided to pt    General Comments        Pertinent Vitals/Pain Pain Assessment: 0-10 Pain Score: 5  Pain Location: L hip Pain Descriptors / Indicators: Sore;Aching Pain Intervention(s): Monitored during session;Premedicated before session    Home Living Family/patient expects to be discharged to:: Private  residence Living Arrangements: Spouse/significant other Available Help at Discharge: Family;Available PRN/intermittently Type of Home: House Home Access: Ramped entrance   Home Layout: One level Home Equipment: Wheelchair - [employer] - 2 wheels      Prior Function Level of Independence: Independent      Comments: Pt Ind with amb community distances without an AD, no other fall history other than current fall, Ind with ADLs   PT Goals (current goals can now be found in the care plan section) Acute Rehab PT Goals Patient Stated Goal: To walk again PT Goal Formulation: With patient Time For Goal Achievement: 01/30/18 Potential to Achieve Goals: Good    Frequency    BID      PT Plan Current plan remains appropriate    Co-evaluation              AM-PAC PT "6 Clicks" Mobility   Outcome Measure  Help needed turning from your back to your side while in a flat bed without using bedrails?: Total Help needed moving from lying on your back to sitting on the side of a flat bed without using bedrails?: Total Help needed moving to and from a bed to a chair (including a wheelchair)?: Total Help needed standing up from a chair using your arms (e.g., wheelchair or bedside chair)?: Total Help needed to walk in hospital room?: Total Help needed climbing 3-5 steps with a railing? : Total 6 Click Score: 6    End of Session Equipment Utilized During Treatment: Gait belt Activity Tolerance: Patient limited by pain Patient left: in bed;with family/visitor present;with call bell/phone within reach;with bed alarm set;Other (comment)(Abd pillow in place) Nurse Communication: Mobility status PT Visit Diagnosis: Muscle weakness (generalized) (M62.81);Other abnormalities of gait and mobility (R26.89);Pain Pain - Right/Left: Left Pain - part of body: Hip     Time: 6834-1962 PT Time Calculation (min) (ACUTE ONLY): 24 min  Charges:  $Therapeutic Exercise: 8-22 mins $Therapeutic  Activity: 8-22 mins                     D. Scott Marilea Gwynne PT, DPT 01/17/18, 3:43 PM

## 2018-01-17 NOTE — Anesthesia Postprocedure Evaluation (Signed)
Anesthesia Post Note  Patient: Dustin Townsend  Procedure(s) Performed: ARTHROPLASTY BIPOLAR HIP (HEMIARTHROPLASTY) (Left )  Patient location during evaluation: Nursing Unit Anesthesia Type: Spinal Level of consciousness: awake, awake and alert and oriented Pain management: pain level controlled Vital Signs Assessment: post-procedure vital signs reviewed and stable Respiratory status: spontaneous breathing, nonlabored ventilation and respiratory function stable Cardiovascular status: stable Postop Assessment: patient able to bend at knees Anesthetic complications: no     Last Vitals:  Vitals:   01/16/18 2339 01/17/18 0338  BP: 119/74 116/70  Pulse: 80 74  Resp: 19 19  Temp: 36.8 C 36.7 C  SpO2: 96% 94%    Last Pain:  Vitals:   01/17/18 0338  TempSrc: Oral  PainSc:                  Casey Burkitt

## 2018-01-17 NOTE — Progress Notes (Signed)
   01/17/18 0230  Clinical Encounter Type  Visited With Patient and family together  Visit Type Follow-up  Referral From Nurse  Spiritual Encounters  Spiritual Needs Other (Comment) (Questions regarding HCPOA)   Chaplain paged to answer questions regarding patient's status as HCPOA for his wife. He was alert and oriented. Two adult sons and daughter-in-law at the bedside amicably discussing next steps. Patient interested in relinquishing HCPOA for his wife to her adult son. Additional questions around that process, access to healthcare information, etc.raised. Chaplain talked with patient and family to address concerns, but unable to provide a conclusive answer to all questions at this time; will consult Social work, Administrator, sports, and/or Palliative Care and return to follow-up.

## 2018-01-17 NOTE — Evaluation (Signed)
Physical Therapy Evaluation Patient Details Name: Dustin Townsend MRN: [id number] DOB: Aug 18, 1933 Today's Date: 01/17/2018   History of Present Illness  Pt is an 85 y.o. male with a known history of abdominal aortic aneurysm, previous CVA, diverticulitis, GERD, hyperlipidemia and hypertension who is presenting to the hospital with fall.  Patient stated that he was throwing a balloon on the wall with his wife who is in rehab.  Patient fell.  Pt diagnosed with a left femoral neck fracture and is s/p L hip hemiarthroplasty.  MD assessment also includes HTN and HLD.      Clinical Impression  Pt presents with deficits in strength, transfers, mobility, gait, balance, and activity tolerance.  Pt required +2 extensive assistance with bed mobility tasks and c/o 9/10 left humeral pain while going from sup to sit. No imaging taken of the LUE noted on file.  Nursing educated on concerns of LUE pain and pt instructed not to use the LUE during the remainder of the session.  Pt was motivated to participate during attempts to stand but ultimately was unable to come to full upright standing but was able to clear the surface of the bed.  Mod verbal and visual cues given to ensure post hip precaution compliance.  Pt will benefit from PT services in a SNF setting upon discharge to safely address above deficits for decreased caregiver assistance and eventual return to PLOF.        Follow Up Recommendations SNF;Supervision for mobility/OOB    Equipment Recommendations  None recommended by PT    Recommendations for Other Services       Precautions / Restrictions Precautions Precautions: Fall;Posterior Hip Precaution Booklet Issued: Yes (comment) Precaution Comments: Hip precaution education provided to pt and family Restrictions Weight Bearing Restrictions: Yes LLE Weight Bearing: Weight bearing as tolerated      Mobility  Bed Mobility Overal bed mobility: Needs Assistance Bed Mobility: Supine to Sit;Sit  to Supine     Supine to sit: +2 for physical assistance;Max assist Sit to supine: +2 for physical assistance;Max assist   General bed mobility comments: Pt able to provide very little assistance during sup to/from sit  Transfers Overall transfer level: Needs assistance Equipment used: Rolling walker (2 wheeled) Transfers: Sit to/from Stand Sit to Stand: From elevated surface         General transfer comment: Pt unable to clear the EOB during sit to/from stand trial with pt instructed not to use LUE to assist secondary to humeral pain associated with a fall  Ambulation/Gait             General Gait Details: Unable  Stairs            Wheelchair Mobility    Modified Rankin (Stroke Patients Only)       Balance Overall balance assessment: Needs assistance   Sitting balance-Leahy Scale: Good         Standing balance comment: Unable to stand                             Pertinent Vitals/Pain Pain Assessment: 0-10 Pain Score: 4  Pain Location: L hip Pain Descriptors / Indicators: Sore Pain Intervention(s): Monitored during session    Home Living Family/patient expects to be discharged to:: Private residence Living Arrangements: Spouse/significant other Available Help at Discharge: Family;Available PRN/intermittently Type of Home: House Home Access: Ramped entrance     Home Layout: One level Home Equipment: Wheelchair - [employer] -  2 wheels      Prior Function Level of Independence: Independent         Comments: Pt Ind with amb community distances without an AD, no other fall history other than current fall, Ind with ADLs     Hand Dominance        Extremity/Trunk Assessment   Upper Extremity Assessment Upper Extremity Assessment: LUE deficits/detail LUE Deficits / Details: Pt c/o 9/10 pain to the left mid upper arm during bed mobility tasks, nursing notified LUE: Unable to fully assess due to pain    Lower Extremity  Assessment Lower Extremity Assessment: Generalized weakness;LLE deficits/detail LLE Deficits / Details: L hip flex <3/5 LLE: Unable to fully assess due to pain LLE Sensation: WNL       Communication   Communication: No difficulties  Cognition Arousal/Alertness: Awake/alert Behavior During Therapy: WFL for tasks assessed/performed Overall Cognitive Status: Within Functional Limits for tasks assessed                                        General Comments      Exercises Total Joint Exercises Ankle Circles/Pumps: AROM;Both;10 reps Quad Sets: Strengthening;Both;10 reps Gluteal Sets: Strengthening;Both;10 reps Heel Slides: AROM;Right;5 reps Hip ABduction/ADduction: AAROM;Both;5 reps Straight Leg Raises: AAROM;Both;5 reps Long Arc Quad: AROM;Both;10 reps;15 reps Knee Flexion: AROM;Both;10 reps;15 reps Other Exercises Other Exercises: Posterior hip precaution education to pt and family including during functional tasks Other Exercises: HEP education per handout   Assessment/Plan    PT Assessment Patient needs continued PT services  PT Problem List Decreased strength;Decreased activity tolerance;Decreased balance;Decreased mobility;Decreased knowledge of use of DME;Decreased knowledge of precautions       PT Treatment Interventions DME instruction;Gait training;Functional mobility training;Therapeutic activities;Therapeutic exercise;Balance training;Patient/family education    PT Goals (Current goals can be found in the Care Plan section)  Acute Rehab PT Goals Patient Stated Goal: To walk again PT Goal Formulation: With patient Time For Goal Achievement: 01/30/18 Potential to Achieve Goals: Good    Frequency BID   Barriers to discharge Inaccessible home environment;Decreased caregiver support      Co-evaluation               AM-PAC PT "6 Clicks" Mobility  Outcome Measure Help needed turning from your back to your side while in a flat bed  without using bedrails?: Total Help needed moving from lying on your back to sitting on the side of a flat bed without using bedrails?: Total Help needed moving to and from a bed to a chair (including a wheelchair)?: Total Help needed standing up from a chair using your arms (e.g., wheelchair or bedside chair)?: Total Help needed to walk in hospital room?: Total Help needed climbing 3-5 steps with a railing? : Total 6 Click Score: 6    End of Session Equipment Utilized During Treatment: Gait belt Activity Tolerance: Patient tolerated treatment well Patient left: in bed;with family/visitor present;with call bell/phone within reach;with bed alarm set;Other (comment)(Abd pillow in place) Nurse Communication: Mobility status;Other (comment)(Pt c/o left humeral pain, no SCDs found on pt upon entering room) PT Visit Diagnosis: Muscle weakness (generalized) (M62.81);Other abnormalities of gait and mobility (R26.89);Pain Pain - Right/Left: Left Pain - part of body: Hip    Time: [id number] PT Time Calculation (min) (ACUTE ONLY): 54 min   Charges:   PT Evaluation $PT Eval Moderate Complexity: 1 Mod PT Treatments $Therapeutic Exercise: 8-22 mins $  Therapeutic Activity: 8-22 mins        D. Scott Taevion Sikora PT, DPT 01/17/18, 1:40 PM

## 2018-01-18 LAB — BASIC METABOLIC PANEL
Anion gap: 8 (ref 5–15)
BUN: 12 mg/dL (ref 8–23)
CHLORIDE: 103 mmol/L (ref 98–111)
CO2: 23 mmol/L (ref 22–32)
Calcium: 8.2 mg/dL — ABNORMAL LOW (ref 8.9–10.3)
Creatinine, Ser: 0.69 mg/dL (ref 0.61–1.24)
GFR calc Af Amer: >60 mL/min (ref >60)
GFR calc non Af Amer: >60 mL/min (ref >60)
Glucose, Bld: 108 mg/dL — ABNORMAL HIGH (ref 70–99)
Potassium: 3.3 mmol/L — ABNORMAL LOW (ref 3.5–5.1)
Sodium: 134 mmol/L — ABNORMAL LOW (ref 135–145)

## 2018-01-18 LAB — CBC
HCT: 36.9 % — ABNORMAL LOW (ref 39.0–52.0)
Hemoglobin: 12.6 g/dL — ABNORMAL LOW (ref 13.0–17.0)
MCH: 33.7 pg (ref 26.0–34.0)
MCHC: 34.1 g/dL (ref 30.0–36.0)
MCV: 98.7 fL (ref 80.0–100.0)
NRBC: 0 % (ref 0.0–0.2)
Platelets: 136 10*3/uL — ABNORMAL LOW (ref 150–400)
RBC: 3.74 MIL/uL — ABNORMAL LOW (ref 4.22–5.81)
RDW: 12.6 % (ref 11.5–15.5)
WBC: 9.6 10*3/uL (ref 4.0–10.5)

## 2018-01-18 LAB — SURGICAL PATHOLOGY

## 2018-01-18 MED ORDER — POTASSIUM CHLORIDE CRYS ER 20 MEQ PO TBCR
40.0000 meq | EXTENDED_RELEASE_TABLET | Freq: Once | ORAL | Status: AC
  Administered 2018-01-18: 40 meq via ORAL
  Filled 2018-01-18: qty 2

## 2018-01-18 MED ORDER — OXYCODONE HCL 5 MG PO TABS: 2.5000 mg | ORAL_TABLET | ORAL | 0 refills | Status: DC | PRN

## 2018-01-18 MED ORDER — TRAMADOL HCL 50 MG PO TABS: 50.0000 mg | ORAL_TABLET | Freq: Four times a day (QID) | ORAL | 1 refills | Status: DC | PRN

## 2018-01-18 MED ORDER — TAMSULOSIN HCL 0.4 MG PO CAPS
0.4000 mg | ORAL_CAPSULE | Freq: Every day | ORAL | Status: DC
  Administered 2018-01-19: 0.4 mg via ORAL
  Filled 2018-01-18: qty 1

## 2018-01-18 MED ORDER — ENOXAPARIN SODIUM 40 MG/0.4ML ~~LOC~~ SOLN: 40.0000 mg | SUBCUTANEOUS | 0 refills | Status: DC

## 2018-01-18 NOTE — Discharge Instructions (Signed)
INSTRUCTIONS AFTER Surgery  o Remove items at home which could result in a fall. This includes throw rugs or furniture in walking pathways o ICE to the affected joint every three hours while awake for 30 minutes at a time, for at least the first 3-5 days, and then as needed for pain and swelling.  Continue to use ice for pain and swelling. You may notice swelling that will progress down to the foot and ankle.  This is normal after surgery.  Elevate your leg when you are not up walking on it.   o Continue to use the breathing machine you got in the hospital (incentive spirometer) which will help keep your temperature down.  It is common for your temperature to cycle up and down following surgery, especially at night when you are not up moving around and exerting yourself.  The breathing machine keeps your lungs expanded and your temperature down.   DIET:  As you were doing prior to hospitalization, we recommend a well-balanced diet.  DRESSING / WOUND CARE / SHOWERING  Dressing changes needed.  Current dressing is waterproof.  Able to bathe with waterproof bandage only.  Staples will be removed in 2 weeks at [facility] clinic orthopedics.  ACTIVITY  o Increase activity slowly as tolerated, but follow the weight bearing instructions below.   o No driving for 6 weeks or until further direction given by your physician.  You cannot drive while taking narcotics.  o No lifting or carrying greater than 10 lbs. until further directed by your surgeon. o Avoid periods of inactivity such as sitting longer than an hour when not asleep. This helps prevent blood clots.  o You may return to work once you are authorized by your doctor.     WEIGHT BEARING  Weightbearing as tolerated   EXERCISES Gait training and ambulation training with physical therapy.  Strengthening.  CONSTIPATION  Constipation is defined medically as fewer than three stools per week and severe constipation as less than one stool per  week.  Even if you have a regular bowel pattern at home, your normal regimen is likely to be disrupted due to multiple reasons following surgery.  Combination of anesthesia, postoperative narcotics, change in appetite and fluid intake all can affect your bowels.   YOU MUST use at least one of the following options; they are listed in order of increasing strength to get the job done.  They are all available over the counter, and you may need to use some, POSSIBLY even all of these options:    Drink plenty of fluids (prune juice may be helpful) and high fiber foods Colace 100 mg by mouth twice a day  Senokot for constipation as directed and as needed Dulcolax (bisacodyl), take with full glass of water  Miralax (polyethylene glycol) once or twice a day as needed.  If you have tried all these things and are unable to have a bowel movement in the first 3-4 days after surgery call either your surgeon or your primary doctor.    If you experience loose stools or diarrhea, hold the medications until you stool forms back up.  If your symptoms do not get better within 1 week or if they get worse, check with your doctor.  If you experience "the worst abdominal pain ever" or develop nausea or vomiting, please contact the office immediately for further recommendations for treatment.   ITCHING:  If you experience itching with your medications, try taking only a single pain pill, or  even half a pain pill at a time.  You can also use Benadryl over the counter for itching or also to help with sleep.   TED HOSE STOCKINGS:  Use stockings on both legs until for at least 2 weeks or as directed by physician office. They may be removed at night for sleeping.  MEDICATIONS:  See your medication summary on the After Visit Summary that nursing will review with you.  You may have some home medications which will be placed on hold until you complete the course of blood thinner medication.  It is important for you to complete  the blood thinner medication as prescribed.  PRECAUTIONS:  If you experience chest pain or shortness of breath - call 911 immediately for transfer to the hospital emergency department.   If you develop a fever greater that 101 F, purulent drainage from wound, increased redness or drainage from wound, foul odor from the wound/dressing, or calf pain - CONTACT YOUR SURGEON.                                                   FOLLOW-UP APPOINTMENTS:  If you do not already have a post-op appointment, please call the office for an appointment to be seen by your surgeon.  Guidelines for how soon to be seen are listed in your After Visit Summary, but are typically between 1-4 weeks after surgery.  OTHER INSTRUCTIONS:     MAKE SURE YOU:   Understand these instructions.   Get help right away if you are not doing well or get worse.    Thank you for letting us be a part of your medical care team.  It is a privilege we respect greatly.  We hope these instructions will help you stay on track for a fast and full recovery!

## 2018-01-18 NOTE — Progress Notes (Signed)
Physical Therapy Treatment Patient Details Name: Dustin Townsend MRN: [id number] DOB: 10-23-1933 Today's Date: 01/18/2018    History of Present Illness Pt is an 85 y.o. male with a known history of abdominal aortic aneurysm, previous CVA, diverticulitis, GERD, hyperlipidemia and hypertension who is presenting to the hospital with fall.  Patient stated that he was throwing a balloon on the wall with his wife who is in rehab.  Patient fell.  Pt diagnosed with a left femoral neck fracture and is s/p L hip hemiarthroplasty.  MD assessment also includes HTN and HLD.      PT Comments    Patient agreeable to PT treatment, reports he is stiff from sitting in the chair. Patient requires mod assist with initial standing balance due to posterior leaning. Cues required for safety. Once balanced in standing patient able to ambulate 15 feet with rw and cga. Slow cadence, decreased foot clearance bilaterally, decreased step length. Patient will continue to benefit from skilled PT to address his difficulty with mobility, decreased independence and decreased safety.       Follow Up Recommendations  SNF;Supervision for mobility/OOB     Equipment Recommendations  None recommended by PT    Recommendations for Other Services       Precautions / Restrictions Precautions Precautions: Fall;Posterior Hip Precaution Booklet Issued: Yes (comment) Restrictions Weight Bearing Restrictions: Yes LLE Weight Bearing: Weight bearing as tolerated    Mobility  Bed Mobility Overal bed mobility: Needs Assistance Bed Mobility: Sit to Supine       Sit to supine: Min assist;Modified independent (Device/Increase time)   General bed mobility comments: patient attempting to assist with sit to supine, but required min A to get situated properly.  Transfers Overall transfer level: Needs assistance Equipment used: Rolling walker (2 wheeled) Transfers: Sit to/from Stand Sit to Stand: Min assist         General  transfer comment: patient has poor initial standing balance with posterior lean, requires cues to advance rw to steady self and decrease posterior leaning.   Ambulation/Gait Ambulation/Gait assistance: Modified independent (Device/Increase time);Min guard Gait Distance (Feet): 15 Feet Assistive device: Rolling walker (2 wheeled) Gait Pattern/deviations: Step-to pattern;Decreased step length - right;Decreased step length - left;Shuffle;Trunk flexed Gait velocity: decreased       Stairs             Wheelchair Mobility    Modified Rankin (Stroke Patients Only)       Balance Overall balance assessment: Needs assistance Sitting-balance support: Feet supported Sitting balance-Leahy Scale: Good     Standing balance support: Bilateral upper extremity supported Standing balance-Leahy Scale: Fair Standing balance comment: posterior leaning with initial standing requiring cues to advance walker to improve balance.                             Cognition Arousal/Alertness: Awake/alert Behavior During Therapy: WFL for tasks assessed/performed Overall Cognitive Status: Within Functional Limits for tasks assessed                                        Exercises Total Joint Exercises Ankle Circles/Pumps: AROM;10 reps;Both Quad Sets: AROM;10 reps;Both Gluteal Sets: AROM;10 reps;Both Heel Slides: AROM;10 reps;Left Hip ABduction/ADduction: AAROM;10 reps;Left    General Comments        Pertinent Vitals/Pain Pain Assessment: 0-10 Pain Score: 3  Pain Location: L hip  Pain Descriptors / Indicators: Aching;Sore;Operative site guarding Pain Intervention(s): Limited activity within patient's tolerance;Monitored during session    Home Living                      Prior Function            PT Goals (current goals can now be found in the care plan section) Acute Rehab PT Goals Patient Stated Goal: To walk again PT Goal Formulation: With  patient Time For Goal Achievement: 01/30/18 Potential to Achieve Goals: Good Progress towards PT goals: Progressing toward goals    Frequency    BID      PT Plan Current plan remains appropriate    Co-evaluation              AM-PAC PT "6 Clicks" Mobility   Outcome Measure  Help needed turning from your back to your side while in a flat bed without using bedrails?: A Lot Help needed moving from lying on your back to sitting on the side of a flat bed without using bedrails?: A Lot Help needed moving to and from a bed to a chair (including a wheelchair)?: A Lot Help needed standing up from a chair using your arms (e.g., wheelchair or bedside chair)?: A Lot Help needed to walk in hospital room?: A Little Help needed climbing 3-5 steps with a railing? : A Lot 6 Click Score: 13    End of Session Equipment Utilized During Treatment: Gait belt Activity Tolerance: Patient tolerated treatment well;Patient limited by fatigue Patient left: in bed;with call bell/phone within reach Nurse Communication: Mobility status PT Visit Diagnosis: Unsteadiness on feet (R26.81);Muscle weakness (generalized) (M62.81);Other abnormalities of gait and mobility (R26.89);History of falling (Z91.81);Pain Pain - Right/Left: Left Pain - part of body: Hip     Time: 1435-1455 PT Time Calculation (min) (ACUTE ONLY): 20 min  Charges:  $Gait Training: 8-22 mins $Therapeutic Exercise: 8-22 mins                     Tereza Gilham, PT, GCS 01/18/18,3:05 PM

## 2018-01-18 NOTE — Plan of Care (Signed)
  Problem: Pain Managment: Goal: General experience of comfort will improve Outcome: Progressing   

## 2018-01-18 NOTE — Evaluation (Signed)
Occupational Therapy Evaluation Patient Details Name: Dustin Townsend MRN: [id number] DOB: 11/01/1933 Today's Date: 01/18/2018    History of Present Illness Pt is an 85 y.o. male with a known history of abdominal aortic aneurysm, previous CVA, diverticulitis, GERD, hyperlipidemia and hypertension who is presenting to the hospital with fall.  Patient stated that he was throwing a balloon on the wall with his wife who is in rehab.  Patient fell.  Pt diagnosed with a left femoral neck fracture and is s/p L hip hemiarthroplasty.  MD assessment also includes HTN and HLD.     Clinical Impression   Pt seen for OT evaluation this date, POD#1 from above surgery. Pt was independent in all ADLs prior to surgery and denies additional falls aside from the one leading to this hospitalization. Pt is eager to return to PLOF with less pain and improved safety and independence. Pt currently requires MOD-MAX assist for sup>sit EOB and MAX assist for LB dressing and bathing while in seated position due to pain and limited AROM of L hip. Pt able to recall 1/3 posterior total hip precautions at start of session and unable to verbalize how to implement during ADL and mobility. Pt instructed in posterior total hip precautions and how to implement, self care skills, falls prevention strategies, home/routines modifications, and DME/AE for LB bathing and dressing tasks. At end of session, pt able to recall 3/3 posterior total hip precautions with 1 verbal cue for no crossing legs. Pt left seated EOB with PT and rehab tech in room for additional therapy. Pt would benefit from additional instruction in self care skills and techniques to help maintain precautions with or without assistive devices to support recall and carryover prior to discharge. Recommend STR upon discharge.      Follow Up Recommendations  SNF    Equipment Recommendations  None recommended by OT    Recommendations for Other Services       Precautions /  Restrictions Precautions Precautions: Fall;Posterior Hip Precaution Comments: pt able to only recall 1/3 at start of session; additional edu/training provided; by end of session pt verbalizes 3/3 precautions with 1 minor verbal cues for crossing legs Restrictions Weight Bearing Restrictions: Yes LLE Weight Bearing: Weight bearing as tolerated      Mobility Bed Mobility Overal bed mobility: Needs Assistance Bed Mobility: Supine to Sit     Supine to sit: Max assist;Mod assist;HOB elevated        Transfers                 General transfer comment: deferred, PT in room to continue mobility efforts    Balance Overall balance assessment: Needs assistance Sitting-balance support: No upper extremity supported;Feet supported Sitting balance-Leahy Scale: Good                                     ADL either performed or assessed with clinical judgement   ADL Overall ADL's : Needs assistance/impaired Eating/Feeding: Sitting;Independent   Grooming: Sitting;Independent   Upper Body Bathing: Sitting;Supervision/ safety;Set up   Lower Body Bathing: Sit to/from stand;Maximal assistance   Upper Body Dressing : Sitting;Set up;Supervision/safety   Lower Body Dressing: Sit to/from stand;Maximal assistance                       Vision Baseline Vision/History: Wears glasses Wears Glasses: At all times Patient Visual Report: No change from baseline  Perception     Praxis      Pertinent Vitals/Pain Pain Assessment: 0-10 Pain Score: 5  Pain Location: L hip Pain Descriptors / Indicators: Sore;Aching Pain Intervention(s): Limited activity within patient's tolerance;Monitored during session;Premedicated before session     Hand Dominance Right   Extremity/Trunk Assessment Upper Extremity Assessment Upper Extremity Assessment: LUE deficits/detail(RUE WFL) LUE Deficits / Details: minimal pain in L shoulder today; ROM WFL, L hand bandaged     Lower Extremity Assessment Lower Extremity Assessment: Generalized weakness;LLE deficits/detail LLE Deficits / Details: L hip flex <3/5 LLE Sensation: WNL   Cervical / Trunk Assessment Cervical / Trunk Assessment: Kyphotic   Communication Communication Communication: No difficulties   Cognition Arousal/Alertness: Awake/alert Behavior During Therapy: WFL for tasks assessed/performed Overall Cognitive Status: Within Functional Limits for tasks assessed                                     General Comments       Exercises Other Exercises Other Exercises: edu/training in posterior hip precautions and how to implement during ADL and bed mobility   Shoulder Instructions      Home Living Family/patient expects to be discharged to:: Private residence Living Arrangements: Spouse/significant other Available Help at Discharge: Family;Available PRN/intermittently Type of Home: House Home Access: Ramped entrance     Home Layout: One level     Bathroom Shower/Tub: Producer, television/film/videoWalk-in shower   Bathroom Toilet: Handicapped height     Home Equipment: Wheelchair - [employer]manual;Walker - 2 wheels;Shower seat - built in;Grab bars - tub/shower;Bedside commode   Additional Comments: has BSC over toilet      Prior Functioning/Environment Level of Independence: Independent        Comments: Pt Ind with amb community distances without an AD, no other fall history other than current fall, Ind with ADLs        OT Problem List: Decreased strength;Decreased range of motion;Decreased knowledge of precautions;Decreased knowledge of use of DME or AE;Pain;Impaired balance (sitting and/or standing)      OT Treatment/Interventions: Self-care/ADL training;Balance training;Therapeutic exercise;Therapeutic activities;DME and/or AE instruction;Patient/family education    OT Goals(Current goals can be found in the care plan section) Acute Rehab OT Goals Patient Stated Goal: To walk again OT Goal  Formulation: With patient Time For Goal Achievement: 02/01/18 Potential to Achieve Goals: Good ADL Goals Pt Will Perform Lower Body Dressing: sit to/from stand;with mod assist;with min assist;with adaptive equipment Pt Will Transfer to Toilet: with mod assist;bedside commode;ambulating;with min assist(LRAD for amb) Additional ADL Goal #1: Pt will verbalize 3/3 posterior hip precautions without cues in order to maximize adherence during recovery. Additional ADL Goal #2: Pt will independently instruct family/caregivers in compression stocking mgt to maximize adherence during recovery.  OT Frequency: Min 2X/week   Barriers to D/C: Decreased caregiver support          Co-evaluation              AM-PAC OT "6 Clicks" Daily Activity     Outcome Measure Help from another person eating meals?: None Help from another person taking care of personal grooming?: None Help from another person toileting, which includes using toliet, bedpan, or urinal?: A Lot Help from another person bathing (including washing, rinsing, drying)?: A Lot Help from another person to put on and taking off regular upper body clothing?: A Little Help from another person to put on and taking off regular lower body clothing?:  A Lot 6 Click Score: 17   End of Session    Activity Tolerance: Patient tolerated treatment well Patient left: in bed;with call bell/phone within reach;Other (comment)(seated EOB with PT in for session)  OT Visit Diagnosis: Other abnormalities of gait and mobility (R26.89);Muscle weakness (generalized) (M62.81);Pain Pain - Right/Left: Left Pain - part of body: Hip;Leg                Time: 0388-8280 OT Time Calculation (min): 30 min Charges:  OT General Charges $OT Visit: 1 Visit OT Evaluation $OT Eval Low Complexity: 1 Low OT Treatments $Self Care/Home Management : 8-22 mins  Richrd Prime, MPH, MS, OTR/L ascom [phone] 01/18/18, 10:01 AM

## 2018-01-18 NOTE — Progress Notes (Signed)
Physical Therapy Treatment Patient Details Name: Dustin Townsend MRN: [id number] DOB: 01/13/1933 Today's Date: 01/18/2018    History of Present Illness Pt is an 85 y.o. male with a known history of abdominal aortic aneurysm, previous CVA, diverticulitis, GERD, hyperlipidemia and hypertension who is presenting to the hospital with fall.  Patient stated that he was throwing a balloon on the wall with his wife who is in rehab.  Patient fell.  Pt diagnosed with a left femoral neck fracture and is s/p L hip hemiarthroplasty.  MD assessment also includes HTN and HLD.      PT Comments    Patient finishing session with OT upon arrival to room. Patient sitting at edge of bed. Agrees to work with PT. Reports mild pain in left hip. Patient requires +2 assist for mobility due to safety this session. Patient able to progress this session ambulating with rw x 15 feet with min assist. Performed long sitting exercises with L LE.  Patient will continue to benefit from skilled PT to address his weakness, functional limitations, and safety.        Follow Up Recommendations  SNF;Supervision/Assistance - 24 hour;Supervision for mobility/OOB     Equipment Recommendations  None recommended by PT    Recommendations for Other Services       Precautions / Restrictions Precautions Precautions: Posterior Hip;Fall Precaution Booklet Issued: Yes (comment) Precaution Comments: pt able to only recall 1/3 at start of session; additional edu/training provided; by end of session pt verbalizes 3/3 precautions with 1 minor verbal cues for crossing legs Restrictions Weight Bearing Restrictions: Yes LLE Weight Bearing: Weight bearing as tolerated    Mobility  Bed Mobility Overal bed mobility: Needs Assistance Bed Mobility: Supine to Sit     Supine to sit: Max assist;Mod assist;HOB elevated     General bed mobility comments: No assessed by PT, patient sitting at EOB upon entering room with OT present.    Transfers Overall transfer level: Needs assistance Equipment used: Rolling walker (2 wheeled) Transfers: Sit to/from Stand Sit to Stand: From elevated surface;Mod assist;+2 physical assistance         General transfer comment: deferred, PT in room to continue mobility efforts  Ambulation/Gait Ambulation/Gait assistance: Modified independent (Device/Increase time);Min guard;+2 safety/equipment Gait Distance (Feet): 15 Feet Assistive device: Rolling walker (2 wheeled) Gait Pattern/deviations: Step-to pattern;Decreased step length - right;Shuffle Gait velocity: decreased   General Gait Details: difficulty advancing right LE, requiring occasisonal assist to take step.   Stairs             Wheelchair Mobility    Modified Rankin (Stroke Patients Only)       Balance Overall balance assessment: Needs assistance Sitting-balance support: Feet supported Sitting balance-Leahy Scale: Good     Standing balance support: Bilateral upper extremity supported Standing balance-Leahy Scale: Fair Standing balance comment: posterior leaning with initial standing requiring cues to advance walker to improve balance.                             Cognition Arousal/Alertness: Awake/alert Behavior During Therapy: WFL for tasks assessed/performed Overall Cognitive Status: Within Functional Limits for tasks assessed                                        Exercises Total Joint Exercises Ankle Circles/Pumps: AROM;10 reps Quad Sets: AROM;10 reps Gluteal Sets: AROM;10  reps Straight Leg Raises: AAROM;10 reps Other Exercises Other Exercises: edu/training in posterior hip precautions and how to implement during ADL and bed mobility    General Comments        Pertinent Vitals/Pain Pain Assessment: 0-10 Pain Score: 3  Pain Location: L hip Pain Descriptors / Indicators: Sore;Aching Pain Intervention(s): Monitored during session    Home Living  Family/patient expects to be discharged to:: Private residence Living Arrangements: Spouse/significant other Available Help at Discharge: Family;Available PRN/intermittently Type of Home: House Home Access: Ramped entrance   Home Layout: One level Home Equipment: Wheelchair - [employer]manual;Walker - 2 wheels;Shower seat - built in;Grab bars - tub/shower;Bedside commode Additional Comments: has BSC over toilet    Prior Function Level of Independence: Independent      Comments: Pt Ind with amb community distances without an AD, no other fall history other than current fall, Ind with ADLs   PT Goals (current goals can now be found in the care plan section) Acute Rehab PT Goals Patient Stated Goal: To walk again PT Goal Formulation: With patient Time For Goal Achievement: 01/30/18 Potential to Achieve Goals: Good    Frequency    BID      PT Plan      Co-evaluation              AM-PAC PT "6 Clicks" Mobility   Outcome Measure  Help needed turning from your back to your side while in a flat bed without using bedrails?: A Lot Help needed moving from lying on your back to sitting on the side of a flat bed without using bedrails?: A Lot Help needed moving to and from a bed to a chair (including a wheelchair)?: A Lot Help needed standing up from a chair using your arms (e.g., wheelchair or bedside chair)?: A Lot Help needed to walk in hospital room?: A Little Help needed climbing 3-5 steps with a railing? : A Lot 6 Click Score: 13    End of Session Equipment Utilized During Treatment: Gait belt Activity Tolerance: Patient tolerated treatment well Patient left: in chair;with call bell/phone within reach;with family/visitor present;with chair alarm set Nurse Communication: Mobility status PT Visit Diagnosis: Muscle weakness (generalized) (M62.81);Difficulty in walking, not elsewhere classified (R26.2);Unsteadiness on feet (R26.81);History of falling (Z91.81);Pain Pain - Right/Left:  Left Pain - part of body: Hip     Time: [id number] PT Time Calculation (min) (ACUTE ONLY): 25 min  Charges:  $Gait Training: 8-22 mins $Therapeutic Exercise: 23-37 mins                     Mellony Danziger, PT, GCS 01/18/18,10:36 AM

## 2018-01-18 NOTE — Progress Notes (Addendum)
Clinical Child psychotherapist (CSW) re-faxed clinicals to [employer] [facility] at their request. Per [employer] case manager they did not receive the clinicals yesterday. [employer] case manager did confirm they received the clinicals.   [employer], LCSW [phone]

## 2018-01-18 NOTE — Progress Notes (Signed)
  Subjective: 2 Days Post-Op Procedure(s) (LRB): ARTHROPLASTY BIPOLAR HIP (HEMIARTHROPLASTY) (Left) Patient reports pain as 5 on 0-10 scale.   Patient is well, and has had no acute complaints or problems PT and Care management to assist with discharge planning. Negative for chest pain and shortness of breath Fever: no Gastrointestinal:Negative for nausea and vomiting  Objective: Vital signs in last 24 hours: Temp:  [97.7 F (36.5 C)-99.3 F (37.4 C)] 99 F (37.2 C) (01/15 2304) Pulse Rate:  [74-86] 74 (01/15 2304) Resp:  [18-19] 19 (01/15 2304) BP: (113-147)/(62-85) 113/62 (01/15 2304) SpO2:  [93 %-98 %] 93 % (01/15 2304)  Intake/Output from previous day:  Intake/Output Summary (Last 24 hours) at 01/18/2018 0558 Last data filed at 01/18/2018 0044 Gross per 24 hour  Intake 1943 ml  Output 1200 ml  Net 743 ml    Intake/Output this shift: Total I/O In: -  Out: 400 [Urine:400]  Labs: Recent Labs    01/15/18 1504 01/16/18 0421 01/17/18 0325 01/18/18 0311  HGB 14.3 13.3 12.5* 12.6*   Recent Labs    01/17/18 0325 01/18/18 0311  WBC 7.1 9.6  RBC 3.72* 3.74*  HCT 37.2* 36.9*  PLT 152 136*   Recent Labs    01/17/18 0325 01/18/18 0311  NA 134* 134*  K 3.5 3.3*  CL 104 103  CO2 24 23  BUN 15 12  CREATININE 0.76 0.69  GLUCOSE 123* 108*  CALCIUM 8.1* 8.2*   Recent Labs    01/16/18 0421  INR 1.01     EXAM General - Patient is Alert, Appropriate and Oriented Extremity - ABD soft Sensation intact distally Intact pulses distally Dorsiflexion/Plantar flexion intact Incision: dressing C/D/I No cellulitis present Dressing/Incision - clean, dry, no drainage Motor Function - intact, moving foot and toes well on exam.  Abdomen soft with normal BS.  Past Medical History:  Diagnosis Date  . AAA (abdominal aortic aneurysm) (HCC)   . CVA (cerebral vascular accident) (HCC)   . Diverticulitis   . GERD (gastroesophageal reflux disease)   . Hyperlipemia   .  Hypertension     Assessment/Plan: 2 Days Post-Op Procedure(s) (LRB): ARTHROPLASTY BIPOLAR HIP (HEMIARTHROPLASTY) (Left) Active Problems:   Hip fracture (HCC)  Estimated body mass index is 25.8 kg/m as calculated from the following:   Height as of this encounter: 5\' 11"  (1.803 m).   Weight as of this encounter: 83.9 kg.  Continue physical therapy. Increase intake as tolerated.  Up with therapy today. Continue working on having a BM. Discharge to rehab when cleared by medicine.  DVT Prophylaxis - Lovenox, Foot Pumps and TED hose Weight-Bearing as tolerated to left leg  Dedra Skeens PA-C [facility] 01/18/2018, 5:58 AM

## 2018-01-18 NOTE — Progress Notes (Signed)
[facility] [place] [facility]   PATIENT NAME: Dustin AbrahamDavid Erekson    MR#:  [id number]  DATE OF BIRTH:  11/14/1933  SUBJECTIVE:  CHIEF COMPLAINT:   Chief Complaint  Patient presents with  . Fall  . Hip Pain   -Intermittent trouble with voiding after his surgery.  Postop day 2 after left hip arthroplasty -Minimal pain noted  REVIEW OF SYSTEMS:  Review of Systems  Constitutional: Negative for chills, fever and malaise/fatigue.  HENT: Negative for congestion, ear discharge, hearing loss and nosebleeds.   Eyes: Negative for blurred vision and double vision.  Respiratory: Negative for cough, shortness of breath and wheezing.   Cardiovascular: Negative for chest pain and palpitations.  Gastrointestinal: Positive for constipation. Negative for abdominal pain, diarrhea, nausea and vomiting.  Genitourinary: Negative for dysuria.  Musculoskeletal: Positive for myalgias.  Neurological: Negative for dizziness, focal weakness, seizures, weakness and headaches.  Psychiatric/Behavioral: Negative for depression.    DRUG ALLERGIES:  No Known Allergies  VITALS:  Blood pressure 118/80, pulse 74, temperature 99 F (37.2 C), temperature source Oral, resp. rate 19, height 5\' 11"  (1.803 m), weight 83.9 kg, SpO2 97 %.  PHYSICAL EXAMINATION:  Physical Exam   GENERAL:  85 y.o.-year-old elderly patient lying in the bed with no acute distress.  EYES: Pupils equal, round, reactive to light and accommodation. No scleral icterus. Extraocular muscles intact.  HEENT: Head atraumatic, normocephalic. Oropharynx and nasopharynx clear.  NECK:  Supple, no jugular venous distention. No thyroid enlargement, no tenderness.  LUNGS: Normal breath sounds bilaterally, no wheezing, rales,rhonchi or crepitation. No use of accessory muscles of respiration.  Decreased bibasilar breath sounds CARDIOVASCULAR: S1, S2 normal. No  rubs, or gallops.  3/6 systolic murmur present ABDOMEN: Soft, nontender,  nondistended. Bowel sounds present. No organomegaly or mass.  EXTREMITIES: Left hip dressing in place.  No bleeding, no tenderness.  No pedal edema, cyanosis, or clubbing.  NEUROLOGIC: Cranial nerves II through XII are intact. Muscle strength 5/5 in all extremities. Sensation intact. Gait not checked.  global weakness noted PSYCHIATRIC: The patient is alert and oriented x 3.  SKIN: No obvious rash, lesion, or ulcer.    LABORATORY PANEL:   CBC Recent Labs  Lab 01/18/18 0311  WBC 9.6  HGB 12.6*  HCT 36.9*  PLT 136*   ------------------------------------------------------------------------------------------------------------------  Chemistries  Recent Labs  Lab 01/18/18 0311  NA 134*  K 3.3*  CL 103  CO2 23  GLUCOSE 108*  BUN 12  CREATININE 0.69  CALCIUM 8.2*   ------------------------------------------------------------------------------------------------------------------  Cardiac Enzymes No results for input(s): TROPONINI in the last 168 hours. ------------------------------------------------------------------------------------------------------------------  RADIOLOGY:  Dg Pelvis Portable  Result Date: 01/16/2018 CLINICAL DATA:  Status post left hip hemiarthroplasty. EXAM: PORTABLE PELVIS 1-2 VIEWS COMPARISON:  Radiographs of same day. FINDINGS: Left femoral prosthesis appears to be well situated. Expected postoperative changes are noted in the surrounding soft tissues. No fracture or dislocation is noted. IMPRESSION: Status post left hip hemiarthroplasty. Electronically Signed   By: Lupita RaiderJames  Green Jr, M.D.   On: 01/16/2018 16:11   Dg Pelvis Portable  Result Date: 01/16/2018 CLINICAL DATA:  Left hip hemiarthroplasty. EXAM: PORTABLE PELVIS 1-2 VIEWS COMPARISON:  CT left hip from yesterday. FINDINGS: Single frontal intraoperative x-ray demonstrates interval placement of a left femoral stem. The femoral head component has not yet been placed. No acute abnormality. Expected  intra-articular air. The right hip joint is unremarkable. IMPRESSION: Left femoral stem placement for left hip hemiarthroplasty. Electronically Signed   By:  Obie DredgeWilliam T Derry M.D.   On: 01/16/2018 15:08   Dg Pelvis Portable  Result Date: 01/16/2018 CLINICAL DATA:  Left hip hemiarthroplasty. EXAM: PORTABLE PELVIS 1-2 VIEWS COMPARISON:  CT left hip from yesterday. FINDINGS: Single frontal intraoperative x-ray demonstrates interval placement of a left femoral stem. The femoral head component has not yet been placed. No acute abnormality. Expected intra-articular air. The right hip joint is unremarkable. IMPRESSION: Left femoral stem placement for left hip hemiarthroplasty. Electronically Signed   By: Obie DredgeWilliam T Derry M.D.   On: 01/16/2018 15:08   Dg Shoulder Left  Result Date: 01/17/2018 CLINICAL DATA:  Left shoulder pain after fall. EXAM: LEFT SHOULDER - 2+ VIEW COMPARISON:  None. FINDINGS: No acute fracture or dislocation. Moderate acromioclavicular joint space narrowing. The glenohumeral joint space is preserved. Tiny globular calcification along the posterior greater tuberosity. Soft tissues are unremarkable. The visualized left lung is clear. IMPRESSION: 1.  No acute osseous abnormality. 2. Moderate acromioclavicular osteoarthritis. 3. Tiny globular calcification along the posterior greater tuberosity could reflect infraspinatus or teres minor calcific tendinitis. Electronically Signed   By: Obie DredgeWilliam T Derry M.D.   On: 01/17/2018 10:22    EKG:   Orders placed or performed during the hospital encounter of 01/15/18  . ED EKG  . ED EKG  . EKG 12-Lead  . EKG 12-Lead    ASSESSMENT AND PLAN:   85 year old male with past medical history significant for hypertension, GERD, history of stroke and AAA presents to hospital secondary to fall.  1.  Fall and left femoral neck fracture-appreciate orthopedics consult.  Patient is status post left hemi-arthroplasty.  Postop day 2 today -Doing well with physical  therapy -We will need rehab placement -Pain control  2.  Hypokalemia-being replaced  3.  Urinary retention-started after surgery.  Has some prostate issues. -Started Flomax.  Received intermittent catheterization here.  Monitor closely  4.  Hypertension-well-controlled at this time.  5.  DVT prophylaxis-on Lovenox  Physical therapy consulted   All the records are reviewed and case discussed with Care Management/Social Workerr. Management plans discussed with the patient, family and they are in agreement.  CODE STATUS: Full Code  TOTAL TIME TAKING CARE OF THIS PATIENT: 39 minutes.   POSSIBLE D/C IN 1-2 DAYS, DEPENDING ON CLINICAL CONDITION.   Enid Baasadhika Therisa Mennella M.D on 01/18/2018 at 2:36 PM  Between 7am to 6pm - Pager - [phone]  After 6pm go to [email or web address] - password [email or web address]  [facility] [place] [facility]  Office  [phone]  CC: Primary care physician; Patient, No Pcp Per

## 2018-01-18 NOTE — Progress Notes (Addendum)
[employer] SNF authorization [facility] has been received, authorization # [id number]. Plan is for patient to D/C to [facility] tomorrow pending medical clearance. Clinical Child psychotherapistocial Worker (CSW) left patient's son Tawanna Coolerodd a Engineer, technical salesvoicemail. Patient's son Loraine LericheMark is aware of above. [facility] liaison is aware of above. Patient is aware of above.   Patient's son Tawanna Coolerodd called CSW back and is aware of above.   [employer]Bailey Deiona Hooper, LCSW [phone]

## 2018-01-18 NOTE — Care Management Important Message (Signed)
Important Message  Patient Details  Name: Dustin Townsend MRN: [id number] Date of Birth: Apr 26, 1933   Medicare Important Message Given:  Yes    Olegario Messier A Leatrice Parilla 01/18/2018, 12:23 PM

## 2018-01-19 LAB — CBC
HCT: 35.5 % — ABNORMAL LOW (ref 39.0–52.0)
Hemoglobin: 12 g/dL — ABNORMAL LOW (ref 13.0–17.0)
MCH: 33.7 pg (ref 26.0–34.0)
MCHC: 33.8 g/dL (ref 30.0–36.0)
MCV: 99.7 fL (ref 80.0–100.0)
Platelets: 146 10*3/uL — ABNORMAL LOW (ref 150–400)
RBC: 3.56 MIL/uL — ABNORMAL LOW (ref 4.22–5.81)
RDW: 12.6 % (ref 11.5–15.5)
WBC: 8.5 10*3/uL (ref 4.0–10.5)
nRBC: 0 % (ref 0.0–0.2)

## 2018-01-19 MED ORDER — BISACODYL 10 MG RE SUPP: 10.0000 mg | Freq: Every day | RECTAL | 0 refills | Status: DC | PRN

## 2018-01-19 MED ORDER — TAMSULOSIN HCL 0.4 MG PO CAPS: 0.4000 mg | ORAL_CAPSULE | Freq: Every day | ORAL | 1 refills | Status: DC

## 2018-01-19 MED ORDER — SENNOSIDES-DOCUSATE SODIUM 8.6-50 MG PO TABS: 1.0000 | ORAL_TABLET | Freq: Two times a day (BID) | ORAL | 0 refills | Status: DC

## 2018-01-19 MED ORDER — ENOXAPARIN SODIUM 40 MG/0.4ML ~~LOC~~ SOLN: 40.0000 mg | SUBCUTANEOUS | 0 refills | Status: DC

## 2018-01-19 NOTE — Progress Notes (Signed)
Patient is medically stable for D/C to [facility] today. Blue [employer] SNF authorization through [facility] has been received. Per Inetta Fermo [facility] liaison patient can come today to room 802. RN will call report and arrange EMS for transport. Clinical Child psychotherapist (CSW) sent D/C orders to [facility] via [facility]. Patient is aware of above. CSW contacted patient's son Loraine Leriche and made him aware of above. CSW left patient's son Tawanna Cooler a Engineer, technical sales. Please reconsult if future social work needs arise. CSW signing off.   [employer], LCSW [phone]

## 2018-01-19 NOTE — Clinical Social Work Placement (Signed)
   CLINICAL SOCIAL WORK PLACEMENT  NOTE  Date:  01/19/2018  Patient Details  Name: Dustin Townsend MRN: [id number] Date of Birth: [birth year]  Clinical Social Work is seeking post-discharge placement for this patient at the Skilled  Nursing Facility level of care (*CSW will initial, date and re-position this form in  chart as items are completed):  Yes   Patient/family provided with [place] Clinical Social Work Department's list of facilities offering this level of care within the geographic area requested by the patient (or if unable, by the patient's family).  Yes   Patient/family informed of their freedom to choose among providers that offer the needed level of care, that participate in Medicare, Medicaid or managed care program needed by the patient, have an available bed and are willing to accept the patient.  Yes   Patient/family informed of [place]'s ownership interest in [facility] and [facility], as well as of the fact that they are under no obligation to receive care at these facilities.  PASRR submitted to EDS on 01/16/18     PASRR number received on 01/16/18     Existing PASRR number confirmed on       FL2 transmitted to all facilities in geographic area requested by pt/family on 01/16/18     FL2 transmitted to all facilities within larger geographic area on       Patient informed that his/her managed care company has contracts with or will negotiate with certain facilities, including the following:        Yes   Patient/family informed of bed offers received.  Patient chooses bed at ([facility] )     Physician recommends and patient chooses bed at      Patient to be transferred to ([facility] ) on 01/19/18.  Patient to be transferred to facility by [facility] )     Patient family notified on 01/19/18 of transfer.  Name of family member notified:  (Patient's son Loraine Leriche is aware of D/C today. )     PHYSICIAN       Additional Comment:     _______________________________________________ Joreen Swearingin, Darleen Crocker, LCSW 01/19/2018, 2:03 PM

## 2018-01-19 NOTE — Discharge Summary (Signed)
[facility] [place] at [facility]   PATIENT NAME: Dustin Townsend    MR#:  [id number]  DATE OF BIRTH:  Sep 01, 1933  DATE OF ADMISSION:  01/15/2018   ADMITTING PHYSICIAN: Auburn Bilberry, MD  DATE OF DISCHARGE: 01/19/18  PRIMARY CARE PHYSICIAN: Patient, No Pcp Per   ADMISSION DIAGNOSIS:   Fall [W19.XXXA] Closed left hip fracture, initial encounter (HCC) [S72.002A]  DISCHARGE DIAGNOSIS:   Active Problems:   Hip fracture (HCC)   SECONDARY DIAGNOSIS:   Past Medical History:  Diagnosis Date  . AAA (abdominal aortic aneurysm) (HCC)   . CVA (cerebral vascular accident) (HCC)   . Diverticulitis   . GERD (gastroesophageal reflux disease)   . Hyperlipemia   . Hypertension     HOSPITAL COURSE:   85 year old male with past medical history significant for hypertension, GERD, history of stroke and AAA presents to hospital secondary to fall.  1.  Fall and left femoral neck fracture-appreciate orthopedics consult.  Patient is status post left hemi-arthroplasty.  Postop day 3 today -Doing well with physical therapy -We will need rehab placement -Pain control, DVT prophylaxis  2.  Hypokalemia- replaced  3.  Urinary retention-started after surgery.  Has some prostate issues. -Started Flomax.  Received intermittent catheterization here. -  -Had to Place Foley prior to discharge due to continued retention.  Do a voiding trial in a couple of days at the rehab.  Continue Flomax for now.  4.  Hypertension-well-controlled at this time.  Not on any oral medications  5.  DVT prophylaxis-on Lovenox for 2 weeks at discharge  Physical therapy consult appreciated.  Discharge today  DISCHARGE CONDITIONS:   Guarded  CONSULTS OBTAINED:   Treatment Team:  Signa Kell, MD  DRUG ALLERGIES:   No Known Allergies DISCHARGE MEDICATIONS:   Allergies as of 01/19/2018   No Known Allergies     Medication List    STOP taking these medications   amLODipine 5 MG  tablet Commonly known as:  NORVASC     TAKE these medications   bisacodyl 10 MG suppository Commonly known as:  DULCOLAX Place 1 suppository (10 mg total) rectally daily as needed for moderate constipation.   enoxaparin 40 MG/0.4ML injection Commonly known as:  LOVENOX Inject 0.4 mLs (40 mg total) into the skin daily for 14 days.   oxyCODONE 5 MG immediate release tablet Commonly known as:  Oxy IR/ROXICODONE Take 0.5-1 tablets (2.5-5 mg total) by mouth every 4 (four) hours as needed for severe pain.   senna-docusate 8.6-50 MG tablet Commonly known as:  Senokot-S Take 1 tablet by mouth 2 (two) times daily.   tamsulosin 0.4 MG Caps capsule Commonly known as:  FLOMAX Take 1 capsule (0.4 mg total) by mouth daily. Start taking on:  January 20, 2018   traMADol 50 MG tablet Commonly known as:  ULTRAM Take 1 tablet (50 mg total) by mouth every 6 (six) hours as needed for moderate pain.        DISCHARGE INSTRUCTIONS:   1.  PCP follow-up in 1 to 2 weeks 2.  Orthopedics follow-up in 2 weeks  DIET:   Cardiac diet  ACTIVITY:   Activity as tolerated  OXYGEN:   Home Oxygen: No.  Oxygen Delivery: room air  DISCHARGE LOCATION:   nursing home   If you experience worsening of your admission symptoms, develop shortness of breath, life threatening emergency, suicidal or homicidal thoughts you must seek medical attention immediately by calling 911 or calling your MD immediately  if symptoms less severe.  You Must read complete instructions/literature along with all the possible adverse reactions/side effects for all the Medicines you take and that have been prescribed to you. Take any new Medicines after you have completely understood and accpet all the possible adverse reactions/side effects.   Please note  You were cared for by a hospitalist during your hospital stay. If you have any questions about your discharge medications or the care you received while you were in the  hospital after you are discharged, you can call the unit and asked to speak with the hospitalist on call if the hospitalist that took care of you is not available. Once you are discharged, your primary care physician will handle any further medical issues. Please note that NO REFILLS for any discharge medications will be authorized once you are discharged, as it is imperative that you return to your primary care physician (or establish a relationship with a primary care physician if you do not have one) for your aftercare needs so that they can reassess your need for medications and monitor your lab values.    On the day of Discharge:  VITAL SIGNS:   Blood pressure 122/70, pulse 88, temperature 98.2 F (36.8 C), temperature source Oral, resp. rate 16, height 5\' 11"  (1.803 m), weight 83.9 kg, SpO2 98 %.  PHYSICAL EXAMINATION:    GENERAL:  85 y.o.-year-old elderly patient lying in the bed with no acute distress.  EYES: Pupils equal, round, reactive to light and accommodation. No scleral icterus. Extraocular muscles intact.  HEENT: Head atraumatic, normocephalic. Oropharynx and nasopharynx clear.  NECK:  Supple, no jugular venous distention. No thyroid enlargement, no tenderness.  LUNGS: Normal breath sounds bilaterally, no wheezing, rales,rhonchi or crepitation. No use of accessory muscles of respiration.  Decreased bibasilar breath sounds CARDIOVASCULAR: S1, S2 normal. No  rubs, or gallops.  3/6 systolic murmur present ABDOMEN: Soft, nontender, nondistended. Bowel sounds present. No organomegaly or mass.  EXTREMITIES: Left hip dressing in place.  No bleeding, no tenderness.  No pedal edema, cyanosis, or clubbing.  NEUROLOGIC: Cranial nerves II through XII are intact. Muscle strength 5/5 in all extremities. Sensation intact. Gait not checked.  global weakness noted PSYCHIATRIC: The patient is alert and oriented x 3.  SKIN: No obvious rash, lesion, or ulcer.Marland Kitchen   DATA REVIEW:   CBC Recent Labs   Lab 01/19/18 0402  WBC 8.5  HGB 12.0*  HCT 35.5*  PLT 146*    Chemistries  Recent Labs  Lab 01/18/18 0311  NA 134*  K 3.3*  CL 103  CO2 23  GLUCOSE 108*  BUN 12  CREATININE 0.69  CALCIUM 8.2*     Microbiology Results  Results for orders placed or performed during the hospital encounter of 01/15/18  Surgical PCR screen     Status: None   Collection Time: 01/15/18  6:06 PM  Result Value Ref Range Status   MRSA, PCR NEGATIVE NEGATIVE Final   Staphylococcus aureus NEGATIVE NEGATIVE Final    Comment: (NOTE) The Xpert SA Assay (FDA approved for NASAL specimens in patients 60 years of age and older), is one component of a comprehensive surveillance program. It is not intended to diagnose infection nor to guide or monitor treatment. Performed at [facility], [address], [place], [place] [address]     RADIOLOGY:  No results found.   Management plans discussed with the patient, family and they are in agreement.  CODE STATUS:     Code Status  Orders  (From admission, onward)         Start     Ordered   01/15/18 1731  Full code  Continuous     01/15/18 1730        Code Status History    This patient has a current code status but no historical code status.      TOTAL TIME TAKING CARE OF THIS PATIENT: 38 minutes.    Enid Baasadhika Kiyoto Slomski M.D on 01/19/2018 at 11:49 AM  Between 7am to 6pm - Pager - [phone]  After 6pm go to [email or web address] - Social research officer, governmentpassword [email or web address]  [facility] [place] [facility]  Office  [phone]  CC: Primary care physician; Patient, No Pcp Per   Note: This dictation was prepared with Dragon dictation along with smaller phrase technology. Any transcriptional errors that result from this process are unintentional.

## 2018-01-19 NOTE — Progress Notes (Signed)
Physical Therapy Treatment Patient Details Name: Dustin Townsend MRN: [id number] DOB: Jul 13, 1933 Today's Date: 01/19/2018    History of Present Illness Pt is an 85 y.o. male with a known history of abdominal aortic aneurysm, previous CVA, diverticulitis, GERD, hyperlipidemia and hypertension who is presenting to the hospital with fall.  Patient stated that he was throwing a balloon on the wall with his wife who is in rehab.  Patient fell.  Pt diagnosed with a left femoral neck fracture and is s/p L hip hemiarthroplasty.  MD assessment also includes HTN and HLD.      PT Comments    Pt presents with deficits in strength, transfers, mobility, gait, balance, and activity tolerance.  Pt required mod A with bed mobility tasks and min A with transfers with cues for both to ensure posterior hip precaution compliance.  Pt was able to amb 15' with CGA and a RW and remains antalgic on the LLE taking very slow, cautious steps with decreased stance time on the LLE and heavy lean on the RW.  Pt will benefit from PT services in a SNF setting upon discharge to safely address above deficits for decreased caregiver assistance and eventual return to PLOF.      Follow Up Recommendations  SNF;Supervision for mobility/OOB     Equipment Recommendations  None recommended by PT    Recommendations for Other Services       Precautions / Restrictions Precautions Precautions: Fall;Posterior Hip Precaution Booklet Issued: Yes (comment) Precaution Comments: Pt able to only recall 2/3 post hip precautions at start of session and was able to recall the 3rd with min cues Restrictions Weight Bearing Restrictions: Yes LLE Weight Bearing: Weight bearing as tolerated    Mobility  Bed Mobility Overal bed mobility: Needs Assistance Bed Mobility: Sit to Supine;Supine to Sit     Supine to sit: Mod assist Sit to supine: Mod assist   General bed mobility comments: Mod verbal cues for sequencing for hip precaution  compliance  Transfers Overall transfer level: Needs assistance Equipment used: Rolling walker (2 wheeled) Transfers: Sit to/from Stand Sit to Stand: Min assist         General transfer comment: Min verbal cues for sequencing for hip precaution compliance  Ambulation/Gait Ambulation/Gait assistance: Min guard Gait Distance (Feet): 15 Feet Assistive device: Rolling walker (2 wheeled) Gait Pattern/deviations: Step-to pattern;Decreased step length - right;Trunk flexed;Decreased stance time - left;Antalgic Gait velocity: decreased   General Gait Details: Pt antalgic on the LLE taking very slow, cautious steps with decreased stance time on the LLE and heavy lean on the RW   Stairs             Wheelchair Mobility    Modified Rankin (Stroke Patients Only)       Balance Overall balance assessment: Needs assistance Sitting-balance support: Feet supported Sitting balance-Leahy Scale: Good     Standing balance support: Bilateral upper extremity supported Standing balance-Leahy Scale: Fair                              Cognition Arousal/Alertness: Awake/alert Behavior During Therapy: WFL for tasks assessed/performed Overall Cognitive Status: Within Functional Limits for tasks assessed                                        Exercises Total Joint Exercises Ankle Circles/Pumps: AROM;10 reps;Both [employer]  Sets: 10 reps;Both;Strengthening Gluteal Sets: 10 reps;Both;Strengthening Towel Squeeze: Strengthening;Both;10 reps Heel Slides: AROM;10 reps;AAROM;Both(AAROM on the LLE, small amplitude) Hip ABduction/ADduction: AROM;AAROM;Both;10 reps(AAROM on the LLE) Straight Leg Raises: AROM;AAROM;Both;10 reps(AAROM on the LLE, small amplitude) Long Arc Quad: AROM;Both;10 reps;15 reps Knee Flexion: AROM;Both;10 reps;15 reps Other Exercises Other Exercises: Posterior hip precaution education with pt during functional mobility    General Comments         Pertinent Vitals/Pain Pain Assessment: 0-10 Pain Score: 3  Pain Location: L hip Pain Descriptors / Indicators: Aching;Sore;Operative site guarding Pain Intervention(s): Monitored during session    Home Living                      Prior Function            PT Goals (current goals can now be found in the care plan section) Progress towards PT goals: Progressing toward goals    Frequency    BID      PT Plan Current plan remains appropriate    Co-evaluation              AM-PAC PT "6 Clicks" Mobility   Outcome Measure  Help needed turning from your back to your side while in a flat bed without using bedrails?: A Lot Help needed moving from lying on your back to sitting on the side of a flat bed without using bedrails?: A Lot Help needed moving to and from a bed to a chair (including a wheelchair)?: A Little Help needed standing up from a chair using your arms (e.g., wheelchair or bedside chair)?: A Little Help needed to walk in hospital room?: A Little Help needed climbing 3-5 steps with a railing? : A Lot 6 Click Score: 15    End of Session Equipment Utilized During Treatment: Gait belt Activity Tolerance: Patient tolerated treatment well Patient left: in bed;with call bell/phone within reach;with bed alarm set(Pt declined up in chair) Nurse Communication: Mobility status PT Visit Diagnosis: Unsteadiness on feet (R26.81);Muscle weakness (generalized) (M62.81);Other abnormalities of gait and mobility (R26.89);History of falling (Z91.81);Pain Pain - Right/Left: Left Pain - part of body: Hip     Time: [id number] PT Time Calculation (min) (ACUTE ONLY): 25 min  Charges:  $Gait Training: 8-22 mins $Therapeutic Exercise: 8-22 mins                     D. Scott Lilou Kneip PT, DPT 01/19/18, 11:17 AM

## 2018-01-19 NOTE — Progress Notes (Signed)
  Subjective: 3 Days Post-Op Procedure(s) (LRB): ARTHROPLASTY BIPOLAR HIP (HEMIARTHROPLASTY) (Left) Patient reports pain as mild.   Patient is well, and has had no acute complaints or problems PT and Care management to assist with discharge planning. Negative for chest pain and shortness of breath Fever: no Gastrointestinal:Negative for nausea and vomiting  Objective: Vital signs in last 24 hours: Temp:  [98.8 F (37.1 C)-99.1 F (37.3 C)] 98.8 F (37.1 C) (01/16 2304) Pulse Rate:  [74-89] 80 (01/16 2304) Resp:  [19] 19 (01/16 2304) BP: (118-136)/(70-87) 132/87 (01/16 2304) SpO2:  [97 %] 97 % (01/16 2304)  Intake/Output from previous day:  Intake/Output Summary (Last 24 hours) at 01/19/2018 0606 Last data filed at 01/18/2018 1700 Gross per 24 hour  Intake 480 ml  Output 0.9 ml  Net 479.1 ml    Intake/Output this shift: No intake/output data recorded.  Labs: Recent Labs    01/17/18 0325 01/18/18 0311 01/19/18 0402  HGB 12.5* 12.6* 12.0*   Recent Labs    01/18/18 0311 01/19/18 0402  WBC 9.6 8.5  RBC 3.74* 3.56*  HCT 36.9* 35.5*  PLT 136* 146*   Recent Labs    01/17/18 0325 01/18/18 0311  NA 134* 134*  K 3.5 3.3*  CL 104 103  CO2 24 23  BUN 15 12  CREATININE 0.76 0.69  GLUCOSE 123* 108*  CALCIUM 8.1* 8.2*   No results for input(s): LABPT, INR in the last 72 hours.   EXAM General - Patient is Alert, Appropriate and Oriented Extremity - ABD soft Sensation intact distally Intact pulses distally Dorsiflexion/Plantar flexion intact Incision: dressing C/D/I No cellulitis present Dressing/Incision - clean, dry, no drainage Motor Function - intact, moving foot and toes well on exam.  Abdomen soft with normal BS.  Past Medical History:  Diagnosis Date  . AAA (abdominal aortic aneurysm) (HCC)   . CVA (cerebral vascular accident) (HCC)   . Diverticulitis   . GERD (gastroesophageal reflux disease)   . Hyperlipemia   . Hypertension      Assessment/Plan: 3 Days Post-Op Procedure(s) (LRB): ARTHROPLASTY BIPOLAR HIP (HEMIARTHROPLASTY) (Left) Active Problems:   Hip fracture (HCC)  Estimated body mass index is 25.8 kg/m as calculated from the following:   Height as of this encounter: 5\' 11"  (1.803 m).   Weight as of this encounter: 83.9 kg.  Continue physical therapy. Increase intake as tolerated.  Up with therapy today. Continue working on having a BM. Discharge to rehab when cleared by medicine.  Probable today  DVT Prophylaxis - Lovenox, Foot Pumps and TED hose Weight-Bearing as tolerated to left leg  Dedra Skeens PA-C [facility] 01/19/2018, 6:06 AM

## 2018-02-06 ENCOUNTER — Telehealth: Payer: Self-pay | Admitting: Urology

## 2018-02-06 NOTE — Telephone Encounter (Signed)
There is no amy pope listed in patient's chart, did she leave a contact number or facility information to contact her?

## 2018-02-06 NOTE — Telephone Encounter (Signed)
Dustin Townsend [facility] and states that the pt is refusing nursing care but she is providing PT to him. She requests a call back please.

## 2018-02-08 ENCOUNTER — Ambulatory Visit (INDEPENDENT_AMBULATORY_CARE_PROVIDER_SITE_OTHER): Payer: Medicare Other | Admitting: Urology

## 2018-02-08 ENCOUNTER — Encounter: Payer: Self-pay | Admitting: Urology

## 2018-02-08 VITALS — BP 122/75 | HR 89 | Resp 14 | Wt 185.2 lb

## 2018-02-08 DIAGNOSIS — R338 Other retention of urine: Secondary | ICD-10-CM | POA: Diagnosis not present

## 2018-02-08 DIAGNOSIS — N138 Other obstructive and reflux uropathy: Secondary | ICD-10-CM

## 2018-02-08 DIAGNOSIS — N401 Enlarged prostate with lower urinary tract symptoms: Secondary | ICD-10-CM | POA: Diagnosis not present

## 2018-02-08 MED ORDER — TAMSULOSIN HCL 0.4 MG PO CAPS: 0.4000 mg | ORAL_CAPSULE | Freq: Every day | ORAL | 3 refills | Status: DC

## 2018-02-08 NOTE — Progress Notes (Signed)
02/08/2018  3:55 PM   Dustin Townsend 03/10/1933 [id number]  Referring provider: No referring provider defined for this encounter.  Chief Complaint  Patient presents with  . New Patient (Initial Visit)   HPI: Dustin BalmDavid Z Townsend is a 85 y.o. White or Caucasian male that presents today for urinary retention. He is accompanied by his sons Tawanna Coolerodd and RowenaMark.  Urinary Retention Started following surgery on 01/16/2018 for a left femoral neck fracture. Foley in place at discharge.  Prior to his surgery he reports he experienced occasional nocturia.  His bag began to leak over the past couple of days and was replaced today. He reports that he has an appointment for voiding trial on 02/13/2018.  He is on Flomax.  Patient underwent Cooled ThermoTherapy on his prostate with Dr. Evelene CroonWolff over 3 years ago.  PMH: Past Medical History:  Diagnosis Date  . AAA (abdominal aortic aneurysm) (HCC)   . CVA (cerebral vascular accident) (HCC)   . Diverticulitis   . GERD (gastroesophageal reflux disease)   . Hyperlipemia   . Hypertension    Surgical History: Past Surgical History:  Procedure Laterality Date  . ABDOMINAL AORTIC ANEURYSM REPAIR    . colectomy    . HIP ARTHROPLASTY Left 01/16/2018   Procedure: ARTHROPLASTY BIPOLAR HIP (HEMIARTHROPLASTY);  Surgeon: Signa KellPatel, Sunny, MD;  Location: [facility] ORS;  Service: Orthopedics;  Laterality: Left;   Home Medications:  Allergies as of 02/08/2018   No Known Allergies     Medication List       Accurate as of February 08, 2018  3:55 PM. Always use your most recent med list.        bisacodyl 10 MG suppository Commonly known as:  DULCOLAX Place 1 suppository (10 mg total) rectally daily as needed for moderate constipation.   enoxaparin 40 MG/0.4ML injection Commonly known as:  LOVENOX Inject 0.4 mLs (40 mg total) into the skin daily for 14 days.   oxyCODONE 5 MG immediate release tablet Commonly known as:  Oxy IR/ROXICODONE Take 0.5-1 tablets (2.5-5 mg  total) by mouth every 4 (four) hours as needed for severe pain.   senna-docusate 8.6-50 MG tablet Commonly known as:  Senokot-S Take 1 tablet by mouth 2 (two) times daily.   tamsulosin 0.4 MG Caps capsule Commonly known as:  FLOMAX Take 1 capsule (0.4 mg total) by mouth daily.   traMADol 50 MG tablet Commonly known as:  ULTRAM Take 1 tablet (50 mg total) by mouth every 6 (six) hours as needed for moderate pain.      Allergies: No Known Allergies  Family History: Family History  Problem Relation Age of Onset  . Hypertension Mother    Social History:  reports that he has been smoking. He has never used smokeless tobacco. No history on file for alcohol and drug.  ROS: UROLOGY Penile pain?: No  Gastrointestinal Nausea?: No Vomiting?: No Indigestion/heartburn?: No Diarrhea?: No Constipation?: No  Constitutional Fever: No Night sweats?: No Weight loss?: No Fatigue?: No  Skin Skin rash/lesions?: No Itching?: No  Eyes Blurred vision?: No Double vision?: No  Ears/Nose/Throat Sore throat?: No Sinus problems?: No  Hematologic/Lymphatic Swollen glands?: No Easy bruising?: No  Cardiovascular Leg swelling?: Yes Chest pain?: No  Respiratory Cough?: No Shortness of breath?: No  Endocrine Excessive thirst?: No  Musculoskeletal Back pain?: No Joint pain?: Yes  Neurological Headaches?: No Dizziness?: No  Psychologic Depression?: No Anxiety?: No  Physical Exam: BP 122/75   Pulse 89   Resp 14   Wt  185 lb 3.2 oz (84 kg)   SpO2 98%   BMI 25.83 kg/m   Constitutional:  Alert and oriented, No acute distress. Respiratory: Normal respiratory effort, no increased work of breathing. Head: Normocephalic and Atraumatic GU: No CVA tenderness. No bladder fullness or masses. Mild penile erosion due to catheterization. Uncircumcised and foreskin easily retracted. Urethral meatus is patient, no discharge, lesions, or rashes. Scrotum without lesions, cysts,  rashes and/or edema. Testicles located bilaterally, no masses. Left and right epididymides are normal. Rectal: Normal sphincter tone, anus and perineum without scarring or rashes. Prostate is enlarged, approximately 50 grams, no nodules appreciated, normal seminal vesicles. Patient has external hemorrhoids. Skin: Large area of errythema surrounding area with mild induration at superior end of intergluteal cleft. Neurologic: Grossly intact, no focal deficits, moving all 4 extremities. Psychiatric: Normal mood and affect.  Laboratory Data: Lab Results  Component Value Date   WBC 8.5 01/19/2018   HGB 12.0 (L) 01/19/2018   HCT 35.5 (L) 01/19/2018   MCV 99.7 01/19/2018   PLT 146 (L) 01/19/2018   Lab Results  Component Value Date   CREATININE 0.69 01/18/2018   Assessment & Plan:    1. Urinary Retention  - Catheter strap repositioned today to prevent further penile erosion  - Patient had an appointment with Dr. Richardo HanksSninsky on 02/13/2018, although it was canceled.  - Plan for patient return on Tuesday for a voiding trial.  2. BPH  - Patient started on Flomax at the hospital and will continue  Return in about 5 days (around 02/13/2018) for voiding trial.  Michiel CowboySHANNON Adryel Wortmann, [facility] [address], [address] [place], [place] [address] [phone]  I, Temidayo Atanda-[place] , am acting as a scribe for [facility] Children[facility]ANNON Cristin Szatkowski, PA-C  I have reviewed the above documentation for accuracy and completeness, and I agree with the above.    Michiel CowboyShannon Eloni Darius, PA-C

## 2018-02-13 ENCOUNTER — Ambulatory Visit (INDEPENDENT_AMBULATORY_CARE_PROVIDER_SITE_OTHER): Payer: Medicare Other | Admitting: Family Medicine

## 2018-02-13 ENCOUNTER — Ambulatory Visit: Payer: Self-pay | Admitting: Urology

## 2018-02-13 ENCOUNTER — Encounter: Payer: Self-pay | Admitting: Urology

## 2018-02-13 VITALS — BP 111/72 | HR 91 | Ht 71.0 in | Wt 185.0 lb

## 2018-02-13 DIAGNOSIS — R338 Other retention of urine: Secondary | ICD-10-CM

## 2018-02-13 LAB — BLADDER SCAN AMB NON-IMAGING

## 2018-02-13 NOTE — Progress Notes (Signed)
Catheter Removal  Patient is present today for a catheter removal.  79ml of water was drained from the balloon. A 16FR foley cath was removed from the bladder no complications were noted . Patient tolerated well.  Small amount of greenish yellow discharge noted around tip of penis.   Preformed by: Delena Bali, CMA   Follow up/ Additional notes: Patient will return this afternoon at 2:00 pm for bladder scan.

## 2018-03-19 NOTE — Progress Notes (Signed)
03/20/2018  8:46 AM   Dustin Townsend 1933-04-10 [id number]  Referring provider: No referring provider defined for this encounter.  Chief Complaint  Patient presents with  . Follow-up    Urinary retention   HPI: Dustin Townsend is a 85 y.o. White or Caucasian male that presents today for urinary retention.  He is accompanied by his son, Loraine Leriche.  Urinary Retention Started following surgery on 01/16/2018 for a left femoral neck fracture, and had a Foley in place at discharge.  Prior to his surgery he reports he experienced occasional nocturia.  Patient underwent Cooled ThermoTherapy on his prostate with Dr. Evelene Croon over 3 years ago.  On 02/08/2018, it was reported that his bag had began to leak over the past couple of days and was replaced on that visit.  He reported that he has an appointment for voiding trial on 02/13/2018.  He was on Flomax.  On 02/13/2018, patient had his catheter removed by Delena Bali, CMA.  His appointment with Dr. Richardo Hanks on that date was cancelled.  Today (03/20/2018), his PVR is 73 mL.  He feels pretty good today about his urinary symptoms, and his son says he is not complaining.  Patient reports a strong stream.  Patient says he has no complaints about how he is urinating.  Patient denies dysuria, gross hematuria, straining, and all other urinary symptoms.  PMH: Past Medical History:  Diagnosis Date  . AAA (abdominal aortic aneurysm) (HCC)   . CVA (cerebral vascular accident) (HCC)   . Diverticulitis   . GERD (gastroesophageal reflux disease)   . Hyperlipemia   . Hypertension    Surgical History: Past Surgical History:  Procedure Laterality Date  . ABDOMINAL AORTIC ANEURYSM REPAIR    . colectomy    . HIP ARTHROPLASTY Left 01/16/2018   Procedure: ARTHROPLASTY BIPOLAR HIP (HEMIARTHROPLASTY);  Surgeon: Signa Kell, MD;  Location: [facility] ORS;  Service: Orthopedics;  Laterality: Left;   Home Medications:  Allergies as of 03/20/2018   No Known Allergies      Medication List       Accurate as of March 20, 2018  8:46 AM. Always use your most recent med list.        clotrimazole-betamethasone lotion Commonly known as:  LOTRISONE Apply topically.   enoxaparin 40 MG/0.4ML injection Commonly known as:  LOVENOX Inject 0.4 mLs (40 mg total) into the skin daily for 14 days.   furosemide 40 MG tablet Commonly known as:  LASIX Take by mouth.   potassium chloride 10 MEQ tablet Commonly known as:  K-DUR,KLOR-CON Take by mouth.   senna-docusate 8.6-50 MG tablet Commonly known as:  Senokot-S Take 1 tablet by mouth 2 (two) times daily.   tamsulosin 0.4 MG Caps capsule Commonly known as:  FLOMAX Take 1 capsule (0.4 mg total) by mouth daily.   traMADol 50 MG tablet Commonly known as:  ULTRAM Take 1 tablet (50 mg total) by mouth every 6 (six) hours as needed for moderate pain.      Allergies: No Known Allergies  Family History: Family History  Problem Relation Age of Onset  . Hypertension Mother    Social History:  reports that he has been smoking. He has never used smokeless tobacco. No history on file for alcohol and drug.  ROS: UROLOGY Frequent Urination?: No Hard to postpone urination?: No Burning/pain with urination?: No Get up at night to urinate?: No Leakage of urine?: No Urine stream starts and stops?: No Trouble starting stream?: No Do you have  to strain to urinate?: No Blood in urine?: No Urinary tract infection?: No Sexually transmitted disease?: No Injury to kidneys or bladder?: No Painful intercourse?: No Weak stream?: No Erection problems?: No Penile pain?: No  Gastrointestinal Nausea?: No Vomiting?: No Indigestion/heartburn?: No Diarrhea?: No Constipation?: No  Constitutional Fever: No Night sweats?: No Weight loss?: No Fatigue?: No  Skin Skin rash/lesions?: No Itching?: No  Eyes Blurred vision?: No Double vision?: No  Ears/Nose/Throat Sore throat?: No Sinus problems?: No   Hematologic/Lymphatic Swollen glands?: No Easy bruising?: No  Cardiovascular Leg swelling?: No Chest pain?: No  Respiratory Cough?: No Shortness of breath?: No  Endocrine Excessive thirst?: No  Musculoskeletal Back pain?: No Joint pain?: No  Neurological Headaches?: No Dizziness?: No  Psychologic Depression?: No Anxiety?: No  Physical Exam: BP (!) 161/95 (BP Location: Left Arm, Patient Position: Sitting, Cuff Size: Normal)   Pulse 73   Ht 5\' 11"  (1.803 m)   Wt 157 lb (71.2 kg)   BMI 21.90 kg/m   Constitutional:  Well nourished. Alert and oriented, No acute distress. Cardiovascular: No clubbing, cyanosis, or edema. Respiratory: Normal respiratory effort, no increased work of breathing. Skin: No rashes, bruises or suspicious lesions. Neurologic: Grossly intact, no focal deficits, moving all 4 extremities. Psychiatric: Normal mood and affect.  Laboratory Data: Lab Results  Component Value Date   WBC 8.5 01/19/2018   HGB 12.0 (L) 01/19/2018   HCT 35.5 (L) 01/19/2018   MCV 99.7 01/19/2018   PLT 146 (L) 01/19/2018   Lab Results  Component Value Date   CREATININE 0.69 01/18/2018   I have reviewed the labs.  Assessment & Plan:  1. Urinary Retention - Resolved  2. BPH - Patient started on Flomax at the hospital and will continue - Patient offered a 90-day supply, and noted he had remaining refills for the previous 30-day prescription; this was changed to a 90-day  Return in about 1 year (around 03/20/2019) for IPSS and PVR.  Michiel Cowboy, PA-C  [facility] [address] [place], [place] [address] [phone]  I, Duanne Moron, am acting as a Neurosurgeon for [employer], PA-C.   I have reviewed the above documentation for accuracy and completeness, and I agree with the above.    Michiel Cowboy, PA-C

## 2018-03-20 ENCOUNTER — Encounter: Payer: Self-pay | Admitting: Urology

## 2018-03-20 ENCOUNTER — Ambulatory Visit: Payer: Medicare Other | Admitting: Urology

## 2018-03-20 ENCOUNTER — Other Ambulatory Visit: Payer: Self-pay

## 2018-03-20 VITALS — BP 161/95 | HR 73 | Ht 71.0 in | Wt 157.0 lb

## 2018-03-20 DIAGNOSIS — N401 Enlarged prostate with lower urinary tract symptoms: Secondary | ICD-10-CM

## 2018-03-20 DIAGNOSIS — R338 Other retention of urine: Secondary | ICD-10-CM

## 2018-03-20 DIAGNOSIS — N138 Other obstructive and reflux uropathy: Secondary | ICD-10-CM

## 2018-03-20 LAB — BLADDER SCAN AMB NON-IMAGING: Scan Result: 73

## 2018-03-20 MED ORDER — TAMSULOSIN HCL 0.4 MG PO CAPS: 0.4000 mg | ORAL_CAPSULE | Freq: Every day | ORAL | 3 refills | Status: DC

## 2018-12-10 ENCOUNTER — Encounter (INDEPENDENT_AMBULATORY_CARE_PROVIDER_SITE_OTHER): Payer: Self-pay | Admitting: Vascular Surgery

## 2019-03-20 ENCOUNTER — Ambulatory Visit: Payer: Medicare Other | Admitting: Urology

## 2019-04-23 ENCOUNTER — Ambulatory Visit: Payer: Medicare Other | Admitting: Urology

## 2019-05-29 ENCOUNTER — Other Ambulatory Visit: Payer: Self-pay | Admitting: Urology

## 2019-05-30 ENCOUNTER — Other Ambulatory Visit: Payer: Self-pay | Admitting: Urology

## 2023-05-07 NOTE — Progress Notes (Signed)
 Subjective:  CC: Chief Complaint  Patient presents with  . Follow-up    6 month follow  up       Patient ID: Dustin Townsend is a 89 y.o. male. This an established patient is here today for Office Visit.  HPI:   Patient is watching diet some, and getting little exercise.  He is fasting including morning medicines today. Pt reports he is overall doing well. Hypertension/ Heart Disease is stable. Pt is taking Blood Pressure medications as instructed. No reportable episodes of hyper or hypotension. Pt denies chest pain or shortness of breath. No dizziness or lightheadedness. Pt endorses checking blood pressure at home. Average readings are lower than today's reading. Pt says he needs new batteries for his BP machine. Advised pt to check BP and let me know if elevated at home for BP med adjustment. No trouble with the irregular HB. Taking daily ASA. Cholesterol is diet controlled. Typically has to get up once during the night to void. Say she usually makes it about 5 hours before having to get up. Usually able to go right back to sleep, sometimes not. Gets a nap during the day. Reflux is stable. Pt reports symptoms are well controlled on current regimen. Minimal breakthrough symptoms/ minimal use of antacids. Pt is attempting to avoid reflux triggers. TD vaccine utd until 2033. Has had PNA and Shingles vaccines x 2. Had RSV vaccine. Colonoscopies no longer indicated   Review of Systems: Review of Systems  Constitutional:  Negative for activity change, appetite change, chills, diaphoresis, fatigue, fever and unexpected weight change.  HENT:  Negative for congestion, ear pain, facial swelling, sinus pressure, sinus pain, tinnitus, trouble swallowing and voice change.   Eyes:  Negative for pain, discharge and redness.  Respiratory:  Negative for cough, choking, chest tightness, shortness of breath and wheezing.   Cardiovascular:  Negative for chest pain, palpitations and leg swelling.   Gastrointestinal:  Negative for abdominal distention, abdominal pain, constipation, diarrhea, nausea and vomiting.  Endocrine: Negative.   Genitourinary:  Negative for difficulty urinating.  Musculoskeletal:  Positive for arthralgias and gait problem. Negative for joint swelling.  Skin:  Negative for pallor and wound.  Allergic/Immunologic: Negative.   Neurological:  Negative for dizziness, seizures, syncope, facial asymmetry, speech difficulty, weakness, light-headedness, numbness and headaches.  Psychiatric/Behavioral:  Negative for agitation, confusion, decreased concentration and self-injury.     Allergies: Aggrenox [aspirin -dipyridamole]  Active Diagnoses/Problem List Patient Active Problem List  Diagnosis  . Mixed hyperlipidemia  . Congenital foot deformity  . GERD (gastroesophageal reflux disease)  . History of colon cancer  . Diverticulosis  . Central retinal artery occlusion of left eye  . Tobacco use  . History of endovascular stent graft for abdominal aortic aneurysm (AAA)  . History of CVA (cerebrovascular accident) without residual deficits  . Essential hypertension  . History of fracture of left hip  . High risk medication use  . Benign localized prostatic hyperplasia with lower urinary tract symptoms (LUTS)  . Osteopenia of hip  . B12 deficiency  . Irregular heartbeat  . Prediabetes  . Vitamin D deficiency  . Post-traumatic osteoarthritis of left hip    Past Medical/Surgical History: Past Medical History:  Diagnosis Date  . AAA (abdominal aortic aneurysm) ()    s/p stent  . B12 deficiency 09/13/2018   B12 level 237 on 09/13/2018  . Central retinal artery occlusion of left eye   . Colon adenoma   . Colon cancer (CMS/HHS-HCC)   . Congenital  foot deformity   . CVA (cerebral vascular accident) (CMS/HHS-HCC)    with weakness of right side, which has since resolved  . Diverticulosis   . GERD (gastroesophageal reflux disease)   . History of fracture of left  hip 01/15/2018   S/P left hip hemiarthroplasty  . HLD (hyperlipidemia)   . Osteopenia of hip 09/17/2018   Minimal T-score -1.9 in right femoral neck on 09/17/2018 bone densitometry, with FRAX of 6.5 and 12%  . Squamous cell skin cancer   . Tobacco use    Past Surgical History:  Procedure Laterality Date  . HEMIARTHROPLASTY HIP Left 01/2018  . AAA stent    . COLECTOMY PARTIAL W/ANASTAMOSIS    . polypectomy (colon)      Social History: Social History   Socioeconomic History  . Marital status: Widowed  Tobacco Use  . Smoking status: Every Day    Types: Cigars    Passive exposure: Never  . Smokeless tobacco: Never  . Tobacco comments:    1/2 PPD  Vaping Use  . Vaping status: Never Used  Substance and Sexual Activity  . Alcohol use: Not Currently    Alcohol/week: 0.0 standard drinks of alcohol  . Drug use: Never  . Sexual activity: Not Currently  Social History Narrative   Lives alone; wife passed away October 04, 2018.  He still drives some.   Social Drivers of Corporate investment banker Strain: Low Risk  (11/04/2022)   Overall Financial Resource Strain (CARDIA)   . Difficulty of Paying Living Expenses: Not very hard  Food Insecurity: No Food Insecurity (11/04/2022)   Hunger Vital Sign   . Worried About Programme researcher, broadcasting/film/video in the Last Year: Never true   . Ran Out of Food in the Last Year: Never true  Transportation Needs: No Transportation Needs (11/04/2022)   PRAPARE - Transportation   . Lack of Transportation (Medical): No   . Lack of Transportation (Non-Medical): No  Social Connections: Unknown (09/15/2018)   Social Connection and Isolation Panel [NHANES]   . Marital Status: Widowed  Housing Stability: Low Risk  (05/06/2023)   Housing Stability Vital Sign   . Unable to Pay for Housing in the Last Year: No   . Number of Times Moved in the Last Year: 0   . Homeless in the Last Year: No    Family History: Family History  Problem Relation Name Age of Onset  . Colon cancer  Mother    . Prostate cancer Father    . Stroke Sister      Immunizations: Immunization History  Administered Date(s) Administered  . COVID-19 Moderna Vaccine (1st,2nd,3rd dose = 0.2ml) 06/07/2019, 07/05/2019  . Flu Vaccine CCIIV3, IM PF, (74MO+)(Flucelvax) 09/27/2022  . Flu Vaccine IIV3, IM with Pres (74MO+)(Afluria, Fluzone) 10/09/2012  . Influenza IIV4, IM PF (3 Yr+) (AFLURIA QUAD) 10/07/2019  . Influenza IIV4, IM preservative 44Yr+ 10/09/2013  . Influenza IIV4, cell derived (Egg-Free) PF (6 mo+) (Flucelvax QUAD) 11/13/2018, 12/11/2020, 12/14/2021  . Influenza, IM unspecified 10/15/2015, 11/01/2016, 10/17/2017, 10/07/2019  . Pneumococcal (PCV20) (>=6WKS) vaccine (Prevnar 20) (aka PCV 20) 11/04/2022  . RSV Adult Vaccine (>=4yr and Pregnancy 32-36 weeks ONLY)(NO Medicare Patients)(ABRYSVO) 11/04/2022  . RZV(>=65YR -OR-19+YRS IF  IMMCOMP) VACCINE (SHINGRIX) 08/26/2019, 12/13/2019  . TDAP (>=5YR) VACCINE (ADACEL/BOOSTRIX) 06/11/2021    Goals:  Goals     . * Maintain health/healthy lifestyle (pt-stated)      What: Maintain a healthy lifestyle  When: tomorrow  Where: home  How:   Importance: 10  Current Medications: Outpatient Medications Marked as Taking for the 05/08/23 encounter (Office Visit) with Steva Clotilda Conn, NP  Medication Sig Dispense Refill  . acetaminophen  (TYLENOL ) 650 MG ER tablet Take 1 tablet (650 mg total) by mouth 2 (two) times daily as needed for Pain 180 tablet 3  . aspirin  81 MG EC tablet Take 1 tablet (81 mg total) by mouth once daily 360 tablet 11  . cholecalciferol (VITAMIN D3) 5,000 unit capsule Take 1 capsule (5,000 Units total) by mouth once daily for Vitamin D Deficiency. 360 capsule 11  . cyanocobalamin (VITAMIN B12) 1000 MCG tablet Take 2 tablets daily for 2 weeks, then reduce to 1 tablet daily thereafter for Vitamin B12 Deficiency. 360 tablet 11  . dilTIAZem  (CARDIZEM  CD) 120 MG XR capsule Take 1 capsule (120 mg total) by mouth  once daily 30 capsule 11  . losartan (COZAAR) 25 MG tablet Take 1 tablet (25 mg total) by mouth once daily 30 tablet 11  . sennosides-docusate (SENOKOT-S) 8.6-50 mg tablet Take 1 tablet by mouth once daily as needed for Constipation       . tamsulosin  (FLOMAX ) 0.4 mg capsule Take 2 capsules (0.8 mg total) by mouth once daily 30 minutes after the same meal each day, for prostate 180 capsule 3    Medication list above reconciled and reviewed for current and on-going appropriateness using patient's verbal report of what he is taking.      Objective:   Vitals:   05/08/23 0842  BP: (!) 162/88  Pulse: 90   Ht:  Wt:83.7 kg (184 lb 9.6 oz) AFP:Anib mass index is 32.7 kg/m.   Physical Exam Vitals and nursing note reviewed.  Constitutional:      General: He is not in acute distress.    Appearance: Normal appearance. He is well-developed. He is not diaphoretic.  HENT:     Head: Normocephalic and atraumatic.     Right Ear: External ear normal. Decreased hearing noted.     Left Ear: External ear normal. Decreased hearing noted.     Nose: Nose normal.     Mouth/Throat:     Pharynx: Uvula midline. No oropharyngeal exudate.  Eyes:     General: Lids are normal. No scleral icterus.       Right eye: No discharge.        Left eye: No discharge.     Conjunctiva/sclera: Conjunctivae normal.     Pupils: Pupils are equal, round, and reactive to light.  Neck:     Thyroid: No thyromegaly.     Vascular: No JVD.     Trachea: Trachea normal. No tracheal tenderness or tracheal deviation.  Cardiovascular:     Rate and Rhythm: Tachycardia present. Rhythm irregular.     Pulses: Normal pulses.          Dorsalis pedis pulses are 2+ on the right side and 2+ on the left side.     Heart sounds: Normal heart sounds. No murmur heard.    No friction rub. No gallop.  Pulmonary:     Effort: Pulmonary effort is normal. No accessory muscle usage or respiratory distress.     Breath sounds: Normal breath sounds.  No stridor. No decreased breath sounds, wheezing, rhonchi or rales.  Chest:     Chest wall: No tenderness.  Abdominal:     General: Bowel sounds are normal. There is no distension.     Palpations: Abdomen is soft.     Tenderness: There is no abdominal tenderness. There  is no guarding.  Musculoskeletal:        General: No tenderness or deformity. Normal range of motion.     Cervical back: Normal range of motion and neck supple.     Comments: Walks with a cane  Lymphadenopathy:     Head:     Right side of head: No submental, submandibular, tonsillar or posterior auricular adenopathy.     Left side of head: No submental, submandibular, tonsillar or posterior auricular adenopathy.     Cervical: No cervical adenopathy.  Skin:    General: Skin is warm and dry.     Coloration: Skin is not pale.     Findings: No erythema or rash.     Nails: There is no clubbing.  Neurological:     Mental Status: He is alert and oriented to person, place, and time.     Cranial Nerves: No cranial nerve deficit.     Sensory: No sensory deficit.     Motor: No tremor, atrophy, abnormal muscle tone or seizure activity.     Coordination: Coordination normal.     Gait: Gait normal.     Deep Tendon Reflexes: Reflexes are normal and symmetric.  Psychiatric:        Speech: Speech normal.        Behavior: Behavior normal.        Thought Content: Thought content normal.        Judgment: Judgment normal.         No visits with results within 3 Month(s) from this visit.  Latest known visit with results is:  Office Visit on 11/04/2022  Component Date Value Ref Range Status  . WBC (White Blood Cell Count) 11/04/2022 7.0  4.1 - 10.2 10^3/uL Final  . RBC (Red Blood Cell Count) 11/04/2022 4.62 (L)  4.69 - 6.13 10^6/uL Final  . Hemoglobin 11/04/2022 15.1  14.1 - 18.1 gm/dL Final  . Hematocrit 88/98/7975 46.1  40.0 - 52.0 % Final  . MCV (Mean Corpuscular Volume) 11/04/2022 99.8  80.0 - 100.0 fl Final  . MCH (Mean  Corpuscular Hemoglobin) 11/04/2022 32.7 (H)  27.0 - 31.2 pg Final  . MCHC (Mean Corpuscular Hemoglobin * 11/04/2022 32.8  32.0 - 36.0 gm/dL Final  . Platelet Count 11/04/2022 203  150 - 450 10^3/uL Final  . RDW-CV (Red Cell Distribution Widt* 11/04/2022 13.5  11.6 - 14.8 % Final  . MPV (Mean Platelet Volume) 11/04/2022 9.8  9.4 - 12.4 fl Final  . Neutrophils 11/04/2022 3.80  1.50 - 7.80 10^3/uL Final  . Lymphocytes 11/04/2022 2.60  1.00 - 3.60 10^3/uL Final  . Mixed Count 11/04/2022 0.60  0.10 - 0.90 10^3/uL Final  . Neutrophil % 11/04/2022 54.1  32.0 - 70.0 % Final  . Lymphocyte % 11/04/2022 37.4  10.0 - 50.0 % Final  . Mixed % 11/04/2022 8.5  3.0 - 14.4 % Final  . Glucose 11/04/2022 89  70 - 110 mg/dL Final  . Sodium 88/98/7975 140  136 - 145 mmol/L Final  . Potassium 11/04/2022 4.5  3.6 - 5.1 mmol/L Final  . Chloride 11/04/2022 103  97 - 109 mmol/L Final  . Carbon Dioxide (CO2) 11/04/2022 28.9  22.0 - 32.0 mmol/L Final  . Urea Nitrogen (BUN) 11/04/2022 17  7 - 25 mg/dL Final  . Creatinine 88/98/7975 1.0  0.7 - 1.3 mg/dL Final  . Glomerular Filtration Rate (eGFR) 11/04/2022 73  >60 mL/min/1.73sq m Final  . Calcium 11/04/2022 9.9  8.7 - 10.3 mg/dL Final  .  AST  11/04/2022 18  8 - 39 U/L Final  . ALT  11/04/2022 19  6 - 57 U/L Final  . Alk Phos (alkaline Phosphatase) 11/04/2022 98  34 - 104 U/L Final  . Albumin 11/04/2022 4.4  3.5 - 4.8 g/dL Final  . Bilirubin, Total 11/04/2022 1.6 (H)  0.3 - 1.2 mg/dL Final  . Protein, Total 11/04/2022 7.1  6.1 - 7.9 g/dL Final  . A/G Ratio 88/98/7975 1.6  1.0 - 5.0 gm/dL Final  . Hemoglobin J8R 11/04/2022 6.4 (H)  4.2 - 5.6 % Final  . Average Blood Glucose (Calc) 11/04/2022 137  mg/dL Final  . Cholesterol, Total 11/04/2022 179  100 - 200 mg/dL Final  . Triglyceride 88/98/7975 68  35 - 199 mg/dL Final  . HDL (High Density Lipoprotein) Cho* 11/04/2022 59.6  29.0 - 71.0 mg/dL Final  . LDL Calculated 11/04/2022 893  0 - 130 mg/dL Final  . VLDL  Cholesterol 11/04/2022 14  mg/dL Final  . Cholesterol/HDL Ratio 11/04/2022 3.0   Final  . Thyroid Stimulating Hormone (TSH) 11/04/2022 0.802  0.450-5.330 uIU/ml uIU/mL Final  . Vitamin B12 11/04/2022 301  >300 pg/mL Final  . Vitamin D, 25-Hydroxy - [facility] 11/04/2022 14.8 (L)  30.0 - 100.0 ng/mL Final     Assessment and Plan:   Diagnoses and all orders for this visit:  Encounter for follow-up  Essential hypertension Assessment & Plan: Suboptimally controlled  - Continue Losartan 25 mg po Q Day  - Continue Diltiazem  CD 120 mg po Q day  - Check CMET today   Orders: -     Basic Metabolic Panel (BMP)  Irregular heartbeat Assessment & Plan: Persistent  - Continue Diltiazem  CD 120 mg po Q day   Mixed hyperlipidemia Assessment & Plan: Stable, diet controlled     Osteopenia of right hip Assessment & Plan: Stable   - Continue Centrum Silver MVI   Vitamin D deficiency Assessment & Plan: Fairly stable  - Continue Centrum Silver- Mens 1 tablet once daily  - Continue Vitamin D 5000 iu po Q day    B12 deficiency Assessment & Plan: Stable   - Continue B12 1000 mcg po Q Day   Gastroesophageal reflux disease without esophagitis Assessment & Plan: Stable    Benign localized prostatic hyperplasia with lower urinary tract symptoms (LUTS) Assessment & Plan: Stable  - Continue Flomax  to 0.8 mg po Q day  - Continue to f/u with Urology as instructed     Personalized Health Advice diet, exercise, weight management, supplements, and mental health concerns  Return in about 6 months (around 11/08/2023), or if symptoms worsen or fail to improve, for Annual Wellness Visit, Fasting Labs., sooner as needed.    Current Medical Providers and Suppliers:   [facility] Patient Care Team: Steva Clotilda Conn, NP as PCP - General (Family Medicine) Helon, Clotilda Caldron, PA as Physician Assistant (Urology) Silver Bristle, CMA as Care Management Support Future Appointments      Date/Time Provider Department Center Visit Type   11/10/2023 9:00 AM Steva Clotilda Conn, NP [facility] OFFICE VISIT        Attestation Statement:   I personally performed the service, non-incident to. (WP)   SHANNON HIATT COWARD, NP  An after visit summary with all of these plans was provided for the patient either in written format or through MyChart.     Clotilda H. Coward, AGNP-C    *Some images could not be shown.

## 2023-09-22 ENCOUNTER — Other Ambulatory Visit: Payer: Self-pay

## 2023-09-22 ENCOUNTER — Emergency Department

## 2023-09-22 ENCOUNTER — Inpatient Hospital Stay
Admission: EM | Admit: 2023-09-22 | Discharge: 2023-10-04 | DRG: 871 | Disposition: E | Attending: Pulmonary Disease | Admitting: Pulmonary Disease

## 2023-09-22 DIAGNOSIS — Z8673 Personal history of transient ischemic attack (TIA), and cerebral infarction without residual deficits: Secondary | ICD-10-CM | POA: Diagnosis not present

## 2023-09-22 DIAGNOSIS — N400 Enlarged prostate without lower urinary tract symptoms: Secondary | ICD-10-CM | POA: Diagnosis present

## 2023-09-22 DIAGNOSIS — Z96642 Presence of left artificial hip joint: Secondary | ICD-10-CM | POA: Diagnosis present

## 2023-09-22 DIAGNOSIS — J690 Pneumonitis due to inhalation of food and vomit: Secondary | ICD-10-CM | POA: Diagnosis not present

## 2023-09-22 DIAGNOSIS — N200 Calculus of kidney: Secondary | ICD-10-CM | POA: Diagnosis present

## 2023-09-22 DIAGNOSIS — Z8249 Family history of ischemic heart disease and other diseases of the circulatory system: Secondary | ICD-10-CM

## 2023-09-22 DIAGNOSIS — Z9181 History of falling: Secondary | ICD-10-CM

## 2023-09-22 DIAGNOSIS — E785 Hyperlipidemia, unspecified: Secondary | ICD-10-CM | POA: Diagnosis present

## 2023-09-22 DIAGNOSIS — J90 Pleural effusion, not elsewhere classified: Secondary | ICD-10-CM

## 2023-09-22 DIAGNOSIS — I4891 Unspecified atrial fibrillation: Principal | ICD-10-CM | POA: Diagnosis present

## 2023-09-22 DIAGNOSIS — K219 Gastro-esophageal reflux disease without esophagitis: Secondary | ICD-10-CM | POA: Diagnosis present

## 2023-09-22 DIAGNOSIS — N136 Pyonephrosis: Secondary | ICD-10-CM | POA: Diagnosis present

## 2023-09-22 DIAGNOSIS — J9601 Acute respiratory failure with hypoxia: Secondary | ICD-10-CM | POA: Diagnosis present

## 2023-09-22 DIAGNOSIS — I5033 Acute on chronic diastolic (congestive) heart failure: Secondary | ICD-10-CM | POA: Diagnosis present

## 2023-09-22 DIAGNOSIS — Z79899 Other long term (current) drug therapy: Secondary | ICD-10-CM

## 2023-09-22 DIAGNOSIS — K631 Perforation of intestine (nontraumatic): Secondary | ICD-10-CM | POA: Diagnosis present

## 2023-09-22 DIAGNOSIS — R188 Other ascites: Secondary | ICD-10-CM | POA: Diagnosis present

## 2023-09-22 DIAGNOSIS — N179 Acute kidney failure, unspecified: Secondary | ICD-10-CM | POA: Diagnosis not present

## 2023-09-22 DIAGNOSIS — I5031 Acute diastolic (congestive) heart failure: Secondary | ICD-10-CM

## 2023-09-22 DIAGNOSIS — Z85038 Personal history of other malignant neoplasm of large intestine: Secondary | ICD-10-CM | POA: Diagnosis not present

## 2023-09-22 DIAGNOSIS — A419 Sepsis, unspecified organism: Secondary | ICD-10-CM | POA: Diagnosis present

## 2023-09-22 DIAGNOSIS — N281 Cyst of kidney, acquired: Secondary | ICD-10-CM | POA: Diagnosis present

## 2023-09-22 DIAGNOSIS — E872 Acidosis, unspecified: Secondary | ICD-10-CM | POA: Diagnosis present

## 2023-09-22 DIAGNOSIS — Z1152 Encounter for screening for COVID-19: Secondary | ICD-10-CM

## 2023-09-22 DIAGNOSIS — F17200 Nicotine dependence, unspecified, uncomplicated: Secondary | ICD-10-CM | POA: Diagnosis present

## 2023-09-22 DIAGNOSIS — Z66 Do not resuscitate: Secondary | ICD-10-CM | POA: Diagnosis present

## 2023-09-22 DIAGNOSIS — Z9049 Acquired absence of other specified parts of digestive tract: Secondary | ICD-10-CM

## 2023-09-22 DIAGNOSIS — K4090 Unilateral inguinal hernia, without obstruction or gangrene, not specified as recurrent: Secondary | ICD-10-CM | POA: Diagnosis present

## 2023-09-22 DIAGNOSIS — I110 Hypertensive heart disease with heart failure: Secondary | ICD-10-CM | POA: Diagnosis present

## 2023-09-22 DIAGNOSIS — R54 Age-related physical debility: Secondary | ICD-10-CM | POA: Diagnosis present

## 2023-09-22 DIAGNOSIS — Z515 Encounter for palliative care: Secondary | ICD-10-CM

## 2023-09-22 DIAGNOSIS — Z23 Encounter for immunization: Secondary | ICD-10-CM

## 2023-09-22 DIAGNOSIS — R6521 Severe sepsis with septic shock: Secondary | ICD-10-CM | POA: Diagnosis not present

## 2023-09-22 DIAGNOSIS — E871 Hypo-osmolality and hyponatremia: Secondary | ICD-10-CM | POA: Diagnosis not present

## 2023-09-22 DIAGNOSIS — N390 Urinary tract infection, site not specified: Secondary | ICD-10-CM

## 2023-09-22 DIAGNOSIS — N201 Calculus of ureter: Secondary | ICD-10-CM

## 2023-09-22 DIAGNOSIS — Z604 Social exclusion and rejection: Secondary | ICD-10-CM | POA: Diagnosis present

## 2023-09-22 DIAGNOSIS — R319 Hematuria, unspecified: Secondary | ICD-10-CM | POA: Diagnosis not present

## 2023-09-22 LAB — COMPREHENSIVE METABOLIC PANEL WITH GFR
ALT: 16 U/L (ref 0–44)
AST: 21 U/L (ref 15–41)
Albumin: 3.7 g/dL (ref 3.5–5.0)
Alkaline Phosphatase: 83 U/L (ref 38–126)
Anion gap: 13 (ref 5–15)
BUN: 20 mg/dL (ref 8–23)
CO2: 22 mmol/L (ref 22–32)
Calcium: 9.1 mg/dL (ref 8.9–10.3)
Chloride: 103 mmol/L (ref 98–111)
Creatinine, Ser: 0.96 mg/dL (ref 0.61–1.24)
GFR, Estimated: >60 mL/min (ref >60)
Glucose, Bld: 161 mg/dL — ABNORMAL HIGH (ref 70–99)
Potassium: 3.4 mmol/L — ABNORMAL LOW (ref 3.5–5.1)
Sodium: 138 mmol/L (ref 135–145)
Total Bilirubin: 1.8 mg/dL — ABNORMAL HIGH (ref 0.0–1.2)
Total Protein: 6.8 g/dL (ref 6.5–8.1)

## 2023-09-22 LAB — URINALYSIS, ROUTINE W REFLEX MICROSCOPIC
Bilirubin Urine: NEGATIVE
Glucose, UA: NEGATIVE mg/dL
Ketones, ur: 5 mg/dL — AB
Nitrite: POSITIVE — AB
Protein, ur: 100 mg/dL — AB
RBC / HPF: >50 RBC/hpf (ref 0–5)
Specific Gravity, Urine: >1.046 — ABNORMAL HIGH (ref 1.005–1.030)
WBC, UA: >50 WBC/hpf (ref 0–5)
pH: 5 (ref 5.0–8.0)

## 2023-09-22 LAB — CBC WITH DIFFERENTIAL/PLATELET
Abs Immature Granulocytes: 0.01 K/uL (ref 0.00–0.07)
Basophils Absolute: 0 K/uL (ref 0.0–0.1)
Basophils Relative: 0 %
Eosinophils Absolute: 0 K/uL (ref 0.0–0.5)
Eosinophils Relative: 0 %
HCT: 43.5 % (ref 39.0–52.0)
Hemoglobin: 14.8 g/dL (ref 13.0–17.0)
Immature Granulocytes: 0 %
Lymphocytes Relative: 15 %
Lymphs Abs: 0.9 K/uL (ref 0.7–4.0)
MCH: 33.3 pg (ref 26.0–34.0)
MCHC: 34 g/dL (ref 30.0–36.0)
MCV: 98 fL (ref 80.0–100.0)
Monocytes Absolute: 0.3 K/uL (ref 0.1–1.0)
Monocytes Relative: 5 %
Neutro Abs: 4.6 K/uL (ref 1.7–7.7)
Neutrophils Relative %: 80 %
Platelets: 186 K/uL (ref 150–400)
RBC: 4.44 MIL/uL (ref 4.22–5.81)
RDW: 13.6 % (ref 11.5–15.5)
WBC: 5.8 K/uL (ref 4.0–10.5)
nRBC: 0 % (ref 0.0–0.2)

## 2023-09-22 LAB — RESP PANEL BY RT-PCR (RSV, FLU A&B, COVID)  RVPGX2
Influenza A by PCR: NEGATIVE
Influenza B by PCR: NEGATIVE
Resp Syncytial Virus by PCR: NEGATIVE
SARS Coronavirus 2 by RT PCR: NEGATIVE

## 2023-09-22 LAB — LACTIC ACID, PLASMA
Lactic Acid, Venous: 2 mmol/L (ref 0.5–1.9)
Lactic Acid, Venous: 2.1 mmol/L (ref 0.5–1.9)

## 2023-09-22 LAB — LIPASE, BLOOD: Lipase: 23 U/L (ref 11–51)

## 2023-09-22 LAB — TROPONIN I (HIGH SENSITIVITY)
Troponin I (High Sensitivity): 12 ng/L (ref <18)
Troponin I (High Sensitivity): 16 ng/L (ref <18)

## 2023-09-22 LAB — BRAIN NATRIURETIC PEPTIDE: B Natriuretic Peptide: 231.7 pg/mL — ABNORMAL HIGH (ref 0.0–100.0)

## 2023-09-22 MED ORDER — ONDANSETRON HCL 4 MG/2ML IJ SOLN
4.0000 mg | Freq: Once | INTRAMUSCULAR | Status: AC
  Administered 2023-09-22: 4 mg via INTRAVENOUS
  Filled 2023-09-22: qty 2

## 2023-09-22 MED ORDER — KETOROLAC TROMETHAMINE 15 MG/ML IJ SOLN
15.0000 mg | Freq: Once | INTRAMUSCULAR | Status: AC
  Administered 2023-09-22: 15 mg via INTRAVENOUS
  Filled 2023-09-22: qty 1

## 2023-09-22 MED ORDER — DILTIAZEM HCL 25 MG/5ML IV SOLN
10.0000 mg | Freq: Once | INTRAVENOUS | Status: AC
  Administered 2023-09-22: 10 mg via INTRAVENOUS
  Filled 2023-09-22: qty 5

## 2023-09-22 MED ORDER — SODIUM CHLORIDE 0.9 % IV SOLN
2.0000 g | Freq: Once | INTRAVENOUS | Status: AC
  Administered 2023-09-22: 2 g via INTRAVENOUS
  Filled 2023-09-22: qty 20

## 2023-09-22 MED ORDER — FENTANYL CITRATE PF 50 MCG/ML IJ SOSY
50.0000 ug | PREFILLED_SYRINGE | Freq: Once | INTRAMUSCULAR | Status: AC
  Administered 2023-09-22: 50 ug via INTRAVENOUS
  Filled 2023-09-22: qty 1

## 2023-09-22 MED ORDER — DILTIAZEM HCL 25 MG/5ML IV SOLN
10.0000 mg | Freq: Once | INTRAVENOUS | Status: AC
  Administered 2023-09-22: 10 mg via INTRAVENOUS

## 2023-09-22 MED ORDER — DILTIAZEM HCL-DEXTROSE 125-5 MG/125ML-% IV SOLN (PREMIX)
5.0000 mg/h | INTRAVENOUS | Status: DC
  Administered 2023-09-22: 5 mg/h via INTRAVENOUS
  Administered 2023-09-23: 15 mg/h via INTRAVENOUS
  Filled 2023-09-22 (×2): qty 125

## 2023-09-22 MED ORDER — HYDROMORPHONE HCL 1 MG/ML IJ SOLN
0.5000 mg | Freq: Once | INTRAMUSCULAR | Status: AC
  Administered 2023-09-22: 0.5 mg via INTRAVENOUS
  Filled 2023-09-22: qty 0.5

## 2023-09-22 MED ORDER — IOHEXOL 350 MG/ML SOLN
100.0000 mL | Freq: Once | INTRAVENOUS | Status: AC | PRN
  Administered 2023-09-22: 100 mL via INTRAVENOUS

## 2023-09-22 NOTE — ED Notes (Signed)
 Pt placed on 3L via [place] at this time due to RA saturation being 86-87%. Pt states pain still has not improved. CT tech to bedside at this time to transport pt to CT.

## 2023-09-22 NOTE — ED Notes (Signed)
 Critical called from Lab. Pt's lactic is 2.0.

## 2023-09-22 NOTE — ED Triage Notes (Signed)
 Pt BIB EMS from home with complaints of generalized weakness and body aches that started this morning. Pt is afebrile.

## 2023-09-22 NOTE — Progress Notes (Signed)
 I spoke to the ED MD regarding patient's admission and the associated problems.  The patient was found to have a non-obstructing stone just proximal to the right UPJ.  I would recommend conservative treatment for this patient and f/u as an outpatient for treatment of his stone.   Please re-consult for any additional problems or concerns.

## 2023-09-22 NOTE — ED Notes (Addendum)
 Grey & blue  top sent down

## 2023-09-22 NOTE — ED Notes (Signed)
 Pt has hx of Afib but has not been on his medication for an unknown amount of time.

## 2023-09-22 NOTE — ED Provider Notes (Signed)
 [facility] Acute Care Provider Note    Event Date/Time   First MD Initiated Contact with Patient 09/22/23 1933     (approximate)   History   Generalized Body Aches and Weakness   HPI  Dustin Townsend is a 89 y.o. male with history of AAA, prior stroke, hypertension who comes in with concerns for weakness.  Patient reports generalized weakness that started today associated with some shortness of breath, pain with breathing as well as some abdominal pain.  He denies any falls hitting his head, confusion, headaches.  He does report thinking that he has a history of atrial fibrillation and I do see where he was on diltiazem  but he just reports being noncompliant with medications.   Physical Exam   Triage Vital Signs: ED Triage Vitals  Encounter Vitals Group     BP 09/22/23 1925 (!) 178/105     Girls Systolic BP Percentile --      Girls Diastolic BP Percentile --      Boys Systolic BP Percentile --      Boys Diastolic BP Percentile --      Pulse Rate 09/22/23 1925 66     Resp 09/22/23 1925 13     Temp 09/22/23 1925 97.7 F (36.5 C)     Temp Source 09/22/23 1925 Oral     SpO2 09/22/23 1925 96 %     Weight 09/22/23 1922 190 lb (86.2 kg)     Height 09/22/23 1922 5' 6 (1.676 m)     Head Circumference --      Peak Flow --      Pain Score 09/22/23 2025 10     Pain Loc --      Pain Education --      Exclude from Growth Chart --     Most recent vital signs: Vitals:   09/22/23 2051 09/22/23 2055  BP:    Pulse: (!) 113 100  Resp: (!) 21 19  Temp:    SpO2: 91% 90%     General: Awake, no distress.  CV:  Good peripheral perfusion.  Resp:  Normal effort.  Clear lungs Abd:  No distention.  Surgical scars noted.  Tender upon palpation Other:  Right leg greater than the left leg.  2+ edema   ED Results / Procedures / Treatments   Labs (all labs ordered are listed, but only abnormal results are displayed) Labs Reviewed  COMPREHENSIVE METABOLIC PANEL WITH  GFR - Abnormal; Notable for the following components:      Result Value   Potassium 3.4 (*)    Glucose, Bld 161 (*)    Total Bilirubin 1.8 (*)    All other components within normal limits  LACTIC ACID, PLASMA - Abnormal; Notable for the following components:   Lactic Acid, Venous 2.0 (*)    All other components within normal limits  RESP PANEL BY RT-PCR (RSV, FLU A&B, COVID)  RVPGX2  CULTURE, BLOOD (ROUTINE X 2)  CULTURE, BLOOD (ROUTINE X 2)  CBC WITH DIFFERENTIAL/PLATELET  LACTIC ACID, PLASMA  URINALYSIS, ROUTINE W REFLEX MICROSCOPIC  BRAIN NATRIURETIC PEPTIDE  LIPASE, BLOOD  TROPONIN I (HIGH SENSITIVITY)     EKG  My interpretation of EKG:  Atrial fibrillation with a rate of 133 without any ST elevation or T wave inversion, PVC  RADIOLOGY I have reviewed the xray personally and interpreted possible pneumonia   PROCEDURES:  Critical Care performed:  .1-3 Lead EKG Interpretation  Performed by: Ernest Ronal BRAVO, MD Authorized  by: Ernest Ronal BRAVO, MD     Interpretation: abnormal     ECG rate:  130   ECG rate assessment: tachycardic     Rhythm: atrial fibrillation     Ectopy: none     Conduction: normal   .Critical Care  Performed by: Ernest Ronal BRAVO, MD Authorized by: Ernest Ronal BRAVO, MD   Critical care provider statement:    Critical care time (minutes):  30   Critical care was necessary to treat or prevent imminent or life-threatening deterioration of the following conditions: Atrial fibrillation.   Critical care was time spent personally by me on the following activities:  Development of treatment plan with patient or surrogate, discussions with consultants, evaluation of patient's response to treatment, examination of patient, ordering and review of laboratory studies, ordering and review of radiographic studies, ordering and performing treatments and interventions, pulse oximetry, re-evaluation of patient's condition and review of old charts    MEDICATIONS ORDERED IN  ED: Medications  diltiazem  (CARDIZEM ) injection 10 mg (has no administration in time range)  diltiazem  (CARDIZEM ) 125 mg in dextrose  5% 125 mL (1 mg/mL) infusion (has no administration in time range)  diltiazem  (CARDIZEM ) injection 10 mg (10 mg Intravenous Given 09/22/23 2019)  fentaNYL  (SUBLIMAZE ) injection 50 mcg (50 mcg Intravenous Given 09/22/23 2025)  ondansetron  (ZOFRAN ) injection 4 mg (4 mg Intravenous Given 09/22/23 2025)     IMPRESSION / MDM / ASSESSMENT AND PLAN / ED COURSE  I reviewed the triage vital signs and the nursing notes.   Patient's presentation is most consistent with acute presentation with potential threat to life or bodily function.   Patient comes in with concerns for weakness, generalized body aches.  Has obvious swelling noted to his right leg being greater than her left.  I suspect patient could have DVT versus PE.  Patient will be get a CT scan to evaluate as well as CT abdomen.  He has no cranial nerve deficit no headaches no confusion no indication for CT head.  Patient also worked up for [employer].  Patient's A-fib was treated with diltiazem  slightly lowered heart rates but then started to jump up so given another dose of diltiazem  and started on diltiazem  drip.  Patient given some fentanyl , Zofran  to help with pain.  Holding off on fluids until we ensure there is no fluid overload given significant swelling in the legs.  Troponins are negative x 2.  Lactate is elevated but flat.  I do not really want to give him fluid given BNP is elevated he is got edema in his legs and CT imaging small bilateral pleural effusions he is on diltiazem  drip secondary to A-fib with RVR.  CT scan does show kidney stone which urology is aware about but no acute intervention today.  Does have a urine that is concerning for UTI but does not meet sepsis criteria we will cover with antibiotics.  Patient having continued pain so I will discuss with the hospitalist for admission for his A-fib with  RVR on diltiazem  drip, possible fluid overload on 2 L, UTI, kidney stone.    The patient is on the cardiac monitor to evaluate for evidence of arrhythmia and/or significant heart rate changes.      FINAL CLINICAL IMPRESSION(S) / ED DIAGNOSES   Final diagnoses:  Atrial fibrillation with RVR (HCC)  Pleural effusion  Urinary tract infection without hematuria, site unspecified  Kidney stone     Rx / DC Orders   ED Discharge Orders  None        Note:  This document was prepared using Dragon voice recognition software and may include unintentional dictation errors.   Ernest Ronal BRAVO, MD 09/22/23 2325

## 2023-09-23 DIAGNOSIS — I5031 Acute diastolic (congestive) heart failure: Secondary | ICD-10-CM

## 2023-09-23 DIAGNOSIS — N200 Calculus of kidney: Secondary | ICD-10-CM

## 2023-09-23 DIAGNOSIS — N201 Calculus of ureter: Secondary | ICD-10-CM

## 2023-09-23 DIAGNOSIS — N400 Enlarged prostate without lower urinary tract symptoms: Secondary | ICD-10-CM

## 2023-09-23 DIAGNOSIS — N390 Urinary tract infection, site not specified: Secondary | ICD-10-CM

## 2023-09-23 LAB — CBC
HCT: 45.8 % (ref 39.0–52.0)
Hemoglobin: 15.2 g/dL (ref 13.0–17.0)
MCH: 32.6 pg (ref 26.0–34.0)
MCHC: 33.2 g/dL (ref 30.0–36.0)
MCV: 98.3 fL (ref 80.0–100.0)
Platelets: 168 K/uL (ref 150–400)
RBC: 4.66 MIL/uL (ref 4.22–5.81)
RDW: 13.8 % (ref 11.5–15.5)
WBC: 2.1 K/uL — ABNORMAL LOW (ref 4.0–10.5)
nRBC: 0 % (ref 0.0–0.2)

## 2023-09-23 LAB — BASIC METABOLIC PANEL WITH GFR
Anion gap: 12 (ref 5–15)
BUN: 23 mg/dL (ref 8–23)
CO2: 24 mmol/L (ref 22–32)
Calcium: 8.7 mg/dL — ABNORMAL LOW (ref 8.9–10.3)
Chloride: 103 mmol/L (ref 98–111)
Creatinine, Ser: 1.01 mg/dL (ref 0.61–1.24)
GFR, Estimated: >60 mL/min (ref >60)
Glucose, Bld: 137 mg/dL — ABNORMAL HIGH (ref 70–99)
Potassium: 3.8 mmol/L (ref 3.5–5.1)
Sodium: 139 mmol/L (ref 135–145)

## 2023-09-23 MED ORDER — ACETAMINOPHEN 650 MG RE SUPP: 650.0000 mg | Freq: Four times a day (QID) | RECTAL | Status: DC | PRN

## 2023-09-23 MED ORDER — INFLUENZA VAC SPLIT HIGH-DOSE 0.5 ML IM SUSY
0.5000 mL | PREFILLED_SYRINGE | INTRAMUSCULAR | Status: AC
Start: 2023-09-24 — End: 2023-09-24
  Administered 2023-09-24: 0.5 mL via INTRAMUSCULAR
  Filled 2023-09-23: qty 0.5

## 2023-09-23 MED ORDER — TRAZODONE HCL 50 MG PO TABS: 25.0000 mg | ORAL_TABLET | Freq: Every evening | ORAL | Status: DC | PRN

## 2023-09-23 MED ORDER — FENTANYL CITRATE PF 50 MCG/ML IJ SOSY
12.5000 ug | PREFILLED_SYRINGE | INTRAMUSCULAR | Status: DC | PRN
  Administered 2023-09-23 – 2023-09-25 (×3): 12.5 ug via INTRAVENOUS
  Filled 2023-09-23 (×4): qty 1

## 2023-09-23 MED ORDER — CEFTRIAXONE SODIUM 1 G IJ SOLR
1.0000 g | INTRAMUSCULAR | Status: DC
  Administered 2023-09-23 – 2023-09-24 (×2): 1 g via INTRAVENOUS
  Filled 2023-09-23 (×2): qty 10

## 2023-09-23 MED ORDER — SENNOSIDES-DOCUSATE SODIUM 8.6-50 MG PO TABS: 1.0000 | ORAL_TABLET | Freq: Every day | ORAL | Status: DC | PRN

## 2023-09-23 MED ORDER — HYDROCODONE-ACETAMINOPHEN 5-325 MG PO TABS
1.0000 | ORAL_TABLET | ORAL | Status: DC | PRN
  Administered 2023-09-23 (×2): 1 via ORAL
  Filled 2023-09-23 (×2): qty 1

## 2023-09-23 MED ORDER — MAGNESIUM HYDROXIDE 400 MG/5ML PO SUSP: 30.0000 mL | Freq: Every day | ORAL | Status: DC | PRN

## 2023-09-23 MED ORDER — ONDANSETRON HCL 4 MG PO TABS: 4.0000 mg | ORAL_TABLET | Freq: Four times a day (QID) | ORAL | Status: DC | PRN

## 2023-09-23 MED ORDER — ONDANSETRON HCL 4 MG/2ML IJ SOLN: 4.0000 mg | Freq: Four times a day (QID) | INTRAMUSCULAR | Status: DC | PRN

## 2023-09-23 MED ORDER — DILTIAZEM HCL 60 MG PO TABS: 60.0000 mg | ORAL_TABLET | Freq: Three times a day (TID) | ORAL | Status: DC

## 2023-09-23 MED ORDER — TAMSULOSIN HCL 0.4 MG PO CAPS
0.4000 mg | ORAL_CAPSULE | Freq: Every day | ORAL | Status: DC
  Administered 2023-09-23 – 2023-09-24 (×2): 0.4 mg via ORAL
  Filled 2023-09-23 (×2): qty 1

## 2023-09-23 MED ORDER — ENOXAPARIN SODIUM 40 MG/0.4ML IJ SOSY
40.0000 mg | PREFILLED_SYRINGE | INTRAMUSCULAR | Status: DC
Start: 2023-09-23 — End: 2023-09-25
  Administered 2023-09-23 – 2023-09-24 (×2): 40 mg via SUBCUTANEOUS
  Filled 2023-09-23 (×2): qty 0.4

## 2023-09-23 MED ORDER — ACETAMINOPHEN 325 MG PO TABS
650.0000 mg | ORAL_TABLET | Freq: Four times a day (QID) | ORAL | Status: DC | PRN
  Administered 2023-09-23: 650 mg via ORAL
  Filled 2023-09-23: qty 2

## 2023-09-23 MED ORDER — FUROSEMIDE 10 MG/ML IJ SOLN
40.0000 mg | Freq: Two times a day (BID) | INTRAMUSCULAR | Status: DC
  Administered 2023-09-23 – 2023-09-24 (×3): 40 mg via INTRAVENOUS
  Filled 2023-09-23 (×3): qty 4

## 2023-09-23 MED ORDER — DILTIAZEM HCL 30 MG PO TABS
60.0000 mg | ORAL_TABLET | Freq: Three times a day (TID) | ORAL | Status: DC
  Administered 2023-09-23 – 2023-09-24 (×3): 60 mg via ORAL
  Filled 2023-09-23: qty 1
  Filled 2023-09-23 (×2): qty 2

## 2023-09-23 NOTE — Assessment & Plan Note (Addendum)
 Cardiology is on board. -Continue with IV Lasix  twice daily -Daily weight and BMP -Strict intake and output -echocardiogram pending

## 2023-09-23 NOTE — Assessment & Plan Note (Signed)
Will continue Flomax.

## 2023-09-23 NOTE — Progress Notes (Signed)
 Progress Note   Patient: Dustin Townsend FMW:[id number] DOB: 07-04-1933 DOA: 09/22/2023     1 DOS: the patient was seen and examined on 09/23/2023   Brief hospital course: Partly taken from H&P.  Dustin Townsend is a 89 y.o. male with medical history significant for CVA, GERD, hypertension and dyslipidemia, who presented to the emergency room with acute onset of worsening dyspnea with associated dry cough and orthopnea. He has been having lower extremity edema bilaterally.   On presentation he was found to be in A-fib with RVR, saturating 96% on 3 L of oxygen.  Labs with blood glucose of 161, potassium 3.2, lactic acid 2>>2.1, BNP 31.7.  Respiratory panel negative, troponin 12>>16. Chest x-ray with left lower lobe opacity suspicious for pneumonia with a possible small left pleural effusion. CTA chest, abdomen and pelvis with no evidence of PE, small bilateral pleural effusions with associated bibasilar atelectasis.  Moderate cardiomegaly.  Also noted and nonobstructing 9 mm stone in the right renal pelvis, no hydronephrosis.  Multiple bilateral renal cysts with no further imaging recommendations.  Mild ascites and a large right inguinal hernia containing several fluid-filled but nondilated small bowel loops, no evidence of bowel obstruction. Also noted interval treatment of patient's prior abdominal aortic aneurysm with patent stent graft.  Patient was started on Cardizem  infusion.  Cardiology was consulted.  Echocardiogram ordered. Also concern of UTI and started on ceftriaxone .  9/20: Heart rate in low 100s, on 3 L of oxygen.  Urology is suggesting outpatient follow-up for nonobstructing renal stone.  UA looks infected with positive nitrites, leukocytes, hematuria and bacteria.  Ordered urine culture as add-on, apparently did not had enough sample in the lab and they ordered recollection, patient is already on antibiotics.  Preliminary blood culture negative. Urology was again consulted as patient  was having right-sided abdominal pain and family was concerned and requesting urologic evaluation for kidney stone.  Assessment and Plan: * Atrial fibrillation with RVR (HCC) Heart rate with some improvement, cardiology is going to transition him from infusion to p.o. Cardizem . Not a candidate for anticoagulation due to increased fall risk. Echocardiogram ordered-pending -Appreciate cardiology help  Acute diastolic CHF (congestive heart failure) [facility]) Cardiology is on board. -Continue with IV Lasix  twice daily -Daily weight and BMP -Strict intake and output -echocardiogram pending  Obstruction of right ureteropelvic junction (UPJ) due to stone - Urology consult to be obtained for follow-up. - Dr. Cam was notified about the patient. - Initial recommendations were for outpatient follow-up at family requested reevaluation as he was having right sided abdominal pain.  UTI (urinary tract infection) UA concerning for UTI. Unfortunately urine cultures were not collected at the time of admission, preliminary blood cultures negative. Urine cultures ordered as add-on but lab does not has enough sample so it was recollected. - Poor utility as patient was already on antibiotic -Continue with ceftriaxone   BPH (benign prostatic hyperplasia) - Will continue Flomax .   Subjective: Patient is complaining of right sided and lower abdominal pain.  Feeling weak and little short of breath, no chest pain.  Physical Exam: Vitals:   09/23/23 1100 09/23/23 1242 09/23/23 1430 09/23/23 1500  BP: 117/86  94/73 94/67  Pulse: (!) 112  99 (!) 56  Resp: (!) 48  (!) 39 (!) 22  Temp:  (!) 97.5 F (36.4 C)    TempSrc:      SpO2: 94%  91% 91%  Weight:      Height:       General.  Ill-appearing, frail elderly man, in no acute distress. Pulmonary.  Lungs clear bilaterally, normal respiratory effort. CV.  Irregularly irregular Abdomen.  Soft, nontender, nondistended, BS positive. CNS.  Alert and  oriented .  No focal neurologic deficit. Extremities.  No edema, no cyanosis, pulses intact and symmetrical.  Data Reviewed: Prior data reviewed  Family Communication:   Disposition: Status is: Inpatient Remains inpatient appropriate because: Severity of illness  Planned Discharge Destination: Home with Home Health  DVT prophylaxis.  Lovenox  Time spent:  minutes  This record has been created using Conservation officer, historic buildings. Errors have been sought and corrected,but may not always be located. Such creation errors do not reflect on the standard of care.   Author: Amaryllis Dare, MD 09/23/2023 4:05 PM  For on call review [email or web address].

## 2023-09-23 NOTE — Consult Note (Signed)
 [facility] CLINIC CARDIOLOGY CONSULT NOTE       Patient ID: RUBY DILONE MRN: [id number] DOB/AGE: [birth year] 89 y.o.  Admit date: 09/22/2023 Referring Physician Dr Caleen Primary Physician Steva, Clotilda DEL, NP Primary Cardiologist none Reason for Consultation Afib RVR & AECHF  HPI: ZAYD BONET is a 89 y.o. male  with a past medical history of HTN, HLD, Afib not on OAC and previous CVA who presented to the ED on 09/22/2023 for dyspnea. Cardiology was placed on consultation for Afib RVR and AECHF. Patient is currently being treated for possible pneumonia and kidney stones. He is a known high fall risk and therefore, not a great candidate for anticoagulation. He is not on anticoagulation at home, only takes baby aspirin . We will continue with rate control strategy in this patient, currently transitioning to PO cardizem . He is on PO cardizem  at home as well. He denies chest pain, palpitations, diaphoresis, syncope.  Review of systems complete and found to be negative unless listed above     Past Medical History:  Diagnosis Date   AAA (abdominal aortic aneurysm) (HCC)    CVA (cerebral vascular accident) (HCC)    Diverticulitis    GERD (gastroesophageal reflux disease)    Hyperlipemia    Hypertension     Past Surgical History:  Procedure Laterality Date   ABDOMINAL AORTIC ANEURYSM REPAIR     colectomy     HIP ARTHROPLASTY Left 01/16/2018   Procedure: ARTHROPLASTY BIPOLAR HIP (HEMIARTHROPLASTY);  Surgeon: Tobie Priest, MD;  Location: [facility] ORS;  Service: Orthopedics;  Laterality: Left;    (Not in a hospital admission)  Social History   Socioeconomic History   Marital status: Married    Spouse name: Not on file   Number of children: Not on file   Years of education: Not on file   Highest education level: Not on file  Occupational History   Not on file  Tobacco Use   Smoking status: Every Day   Smokeless tobacco: Never  Substance and Sexual Activity   Alcohol  use: Not on file    Drug use: Not on file   Sexual activity: Not on file  Other Topics Concern   Not on file  Social History Narrative   Not on file   Social Drivers of Health   Financial Resource Strain: Low Risk  (11/04/2022)   Received from [facility]   Overall Financial Resource Strain (CARDIA)    Difficulty of Paying Living Expenses: Not very hard  Food Insecurity: No Food Insecurity (11/04/2022)   Received from [facility]   Hunger Vital Sign    Within the past 12 months, you worried that your food would run out before you got the money to buy more.: Never true    Within the past 12 months, the food you bought just didn't last and you didn't have money to get more.: Never true  Transportation Needs: No Transportation Needs (11/04/2022)   Received from [facility] - Transportation    In the past 12 months, has lack of transportation kept you from medical appointments or from getting medications?: No    Lack of Transportation (Non-Medical): No  Physical Activity: Not on file  Stress: Not on file  Social Connections: Unknown (09/15/2018)   Received from [facility]   Social Connection and Isolation Panel    Frequency of Communication with Friends and Family: Not on file    Frequency of Social Gatherings  with Friends and Family: Not on file    Attends Religious Services: Not on file    Active Member of Clubs or Organizations: Not on file    Attends Club or Organization Meetings: Not on file    Are you married, widowed, divorced, separated, never married, or living with a partner?: Widowed  Intimate Partner Violence: Not on file    Family History  Problem Relation Age of Onset   Hypertension Mother      Vitals:   09/23/23 0827 09/23/23 0828 09/23/23 1100 09/23/23 1242  BP:   117/86   Pulse: (!) 103  (!) 112   Resp: 20  (!) 48   Temp:  99 F (37.2 C)  (!) 97.5 F (36.4 C)  TempSrc:  Oral    SpO2: 90%  94%    Weight:      Height:        PHYSICAL EXAM General: awake, well nourished, in no acute distress. HEENT: Normocephalic and atraumatic. Neck: No JVD.  Lungs: Normal respiratory effort.  Heart: HiRRR. Normal S1 and S2 without gallops or murmurs.  Abdomen: Non-distended appearing.  Msk: Normal strength and tone for age. Extremities: Warm and well perfused. No clubbing, cyanosis. 1+ edema.  Neuro: Alert and oriented X 3. Psych: Answers questions appropriately.   Labs: Basic Metabolic Panel: Recent Labs    09/22/23 1935 09/23/23 0414  NA 138 139  K 3.4* 3.8  CL 103 103  CO2 22 24  GLUCOSE 161* 137*  BUN 20 23  CREATININE 0.96 1.01  CALCIUM 9.1 8.7*   Liver Function Tests: Recent Labs    09/22/23 1935  AST 21  ALT 16  ALKPHOS 83  BILITOT 1.8*  PROT 6.8  ALBUMIN 3.7   Recent Labs    09/22/23 1935  LIPASE 23   CBC: Recent Labs    09/22/23 1935 09/23/23 0414  WBC 5.8 2.1*  NEUTROABS 4.6  --   HGB 14.8 15.2  HCT 43.5 45.8  MCV 98.0 98.3  PLT 186 168   Cardiac Enzymes: Recent Labs    09/22/23 1935 09/22/23 2219  TROPONINIHS 12 16   BNP: Recent Labs    09/22/23 1935  BNP 231.7*   D-Dimer: No results for input(s): DDIMER in the last 72 hours. Hemoglobin A1C: No results for input(s): HGBA1C in the last 72 hours. Fasting Lipid Panel: No results for input(s): CHOL, HDL, LDLCALC, TRIG, CHOLHDL, LDLDIRECT in the last 72 hours. Thyroid Function Tests: No results for input(s): TSH, T4TOTAL, T3FREE, THYROIDAB in the last 72 hours.  Invalid input(s): FREET3 Anemia Panel: No results for input(s): VITAMINB12, FOLATE, FERRITIN, TIBC, IRON, RETICCTPCT in the last 72 hours.   Radiology: CT Angio Chest PE W and/or Wo Contrast Result Date: 09/22/2023 CLINICAL DATA:  Generalized weakness and body aches beginning this morning. EXAM: CT ANGIOGRAPHY CHEST CT ABDOMEN AND PELVIS WITH CONTRAST TECHNIQUE: Multidetector CT imaging  of the chest was performed using the standard protocol during bolus administration of intravenous contrast. Multiplanar CT image reconstructions and MIPs were obtained to evaluate the vascular anatomy. Multidetector CT imaging of the abdomen and pelvis was performed using the standard protocol during bolus administration of intravenous contrast. RADIATION DOSE REDUCTION: This exam was performed according to the departmental dose-optimization program which includes automated exposure control, adjustment of the mA and/or kV according to patient size and/or use of iterative reconstruction technique. CONTRAST:  OMNIPAQUE  IOHEXOL  350 MG/ML SOLN COMPARISON:  CTA abdomen 09/23/2009 FINDINGS: CTA CHEST FINDINGS Cardiovascular: Moderate  cardiomegaly. Left main and 3 vessel atherosclerotic coronary artery disease. Thoracic aorta is normal in caliber. There is moderate calcified plaque throughout the thoracic aorta. Pulmonary arterial system is well opacified and demonstrates no evidence of emboli. Mediastinum/Nodes: No mediastinal or hilar adenopathy. Remaining mediastinal structures are unremarkable. Lungs/Pleura: Lungs are adequately inflated demonstrate mild centrilobular emphysematous disease. Small bilateral pleural effusions with associated bibasilar atelectasis. Airways are unremarkable. Musculoskeletal: No focal abnormality. Review of the MIP images confirms the above findings. CT ABDOMEN and PELVIS FINDINGS Hepatobiliary: Liver, gallbladder and biliary tree are unremarkable. Pancreas: Normal. Spleen: Normal. Adrenals/Urinary Tract: Adrenal glands are normal. Multiple bilateral renal cysts are present with the largest cyst measuring 5 cm over the lower pole left kidney. No further imaging follow-up recommended. There are a few calcifications over the left renal pelvis and corticomedullary junction likely vascular. Few small calcifications in the region of the right renal pelvis likely vascular. There is a 9 mm  stone over the right renal pelvis just proximal to the UPJ. The ureters are within normal although the distal ureters are not visualized due to streak artifact from the adjacent left hip prosthesis. Stomach/Bowel: Stomach is within normal. Appendix is normal. There is diverticulosis of the colon. Vascular/Lymphatic: There is calcified plaque over the abdominal aorta. Interval treatment of patient's previously seen abdominal aortic aneurysm as there is been placement of aortoiliac stent graft which is patent. 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 Remaining vascular structures are unremarkable. No evidence of adenopathy. Reproductive: Prostate is unremarkable although partially obscured by streak artifact. Other: Large right inguinal hernia containing several fluid-filled but nondilated small bowel loops. Small amount of free fluid within the inguinal canal. Mild ascites. Musculoskeletal: No focal abnormality. Review of the MIP images confirms the above findings. IMPRESSION: 1. No evidence of pulmonary embolism. 2. Small bilateral pleural effusions with associated bibasilar atelectasis. 3. Moderate cardiomegaly. Atherosclerotic coronary artery disease. 4. 9 mm stone over the right renal pelvis just proximal to the UPJ. No evidence of hydronephrosis. 5. Multiple bilateral renal cysts with the largest cyst measuring 5 cm over the lower pole left kidney. No further imaging follow-up recommended. 6. Mild ascites. 7. Colonic diverticulosis. 8. Large right inguinal hernia containing several fluid-filled but nondilated small bowel loops. No evidence of bowel obstruction. Small amount of free fluid within the inguinal canal. 9. Interval treatment of patient's previously seen abdominal aortic aneurysm as there has been placement of aortoiliac stent graft which is patent. 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 10. Aortic atherosclerosis. Aortic Atherosclerosis (ICD10-I70.0) and Emphysema (ICD10-J43.9). Electronically Signed   By: Toribio Agreste M.D.   On: 09/22/2023 21:53   CT ABDOMEN PELVIS W CONTRAST Result Date: 09/22/2023 CLINICAL DATA:  Generalized weakness and body aches beginning this morning. EXAM: CT ANGIOGRAPHY CHEST CT ABDOMEN AND PELVIS WITH CONTRAST TECHNIQUE: Multidetector CT imaging of the chest was performed using the standard protocol during bolus administration of intravenous contrast. Multiplanar CT image reconstructions and MIPs were obtained to evaluate the vascular anatomy. Multidetector CT imaging of the abdomen and pelvis was performed using the standard protocol during bolus administration of intravenous contrast. RADIATION DOSE REDUCTION: This exam was performed according to the departmental dose-optimization program which includes automated exposure control, adjustment of the mA and/or kV according to patient size and/or use of iterative reconstruction technique.  CONTRAST:  OMNIPAQUE  IOHEXOL  350 MG/ML SOLN COMPARISON:  CTA abdomen 09/23/2009 FINDINGS: CTA CHEST FINDINGS Cardiovascular: Moderate cardiomegaly. Left main and 3 vessel atherosclerotic coronary artery disease. Thoracic aorta is normal in caliber. There is moderate calcified plaque throughout the thoracic aorta. Pulmonary arterial system is well opacified and demonstrates no evidence of emboli. Mediastinum/Nodes: No mediastinal or hilar adenopathy. Remaining mediastinal structures are unremarkable. Lungs/Pleura: Lungs are adequately inflated demonstrate mild centrilobular emphysematous disease. Small bilateral pleural effusions with associated bibasilar atelectasis. Airways are unremarkable. Musculoskeletal: No focal abnormality. Review of the MIP images confirms the above findings. CT ABDOMEN and PELVIS FINDINGS Hepatobiliary: Liver, gallbladder and biliary tree are  unremarkable. Pancreas: Normal. Spleen: Normal. Adrenals/Urinary Tract: Adrenal glands are normal. Multiple bilateral renal cysts are present with the largest cyst measuring 5 cm over the lower pole left kidney. No further imaging follow-up recommended. There are a few calcifications over the left renal pelvis and corticomedullary junction likely vascular. Few small calcifications in the region of the right renal pelvis likely vascular. There is a 9 mm stone over the right renal pelvis just proximal to the UPJ. The ureters are within normal although the distal ureters are not visualized due to streak artifact from the adjacent left hip prosthesis. Stomach/Bowel: Stomach is within normal. Appendix is normal. There is diverticulosis of the colon. Vascular/Lymphatic: There is calcified plaque over the abdominal aorta. Interval treatment of patient's previously seen abdominal aortic aneurysm as there is been placement of aortoiliac stent graft which is patent. 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 Remaining vascular structures are unremarkable. No evidence of adenopathy. Reproductive: Prostate is unremarkable although partially obscured by streak artifact. Other: Large right inguinal hernia containing several fluid-filled but nondilated small bowel loops. Small amount of free fluid within the inguinal canal. Mild ascites. Musculoskeletal: No focal abnormality. Review of the MIP images confirms the above findings. IMPRESSION: 1. No evidence of pulmonary embolism. 2. Small bilateral pleural effusions with associated bibasilar atelectasis. 3. Moderate cardiomegaly. Atherosclerotic coronary artery disease. 4. 9 mm stone over the right renal pelvis just proximal to the UPJ. No evidence of hydronephrosis. 5. Multiple bilateral renal cysts with the largest cyst measuring 5 cm over the lower pole left kidney. No further imaging follow-up  recommended. 6. Mild ascites. 7. Colonic diverticulosis. 8. Large right inguinal hernia containing several fluid-filled but nondilated small bowel loops. No evidence of bowel obstruction. Small amount of free fluid within the inguinal canal. 9. Interval treatment of patient's previously seen abdominal aortic aneurysm as there has been placement of aortoiliac stent graft which is patent. 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 10. Aortic atherosclerosis. Aortic Atherosclerosis (ICD10-I70.0) and Emphysema (ICD10-J43.9). Electronically Signed   By: Toribio Agreste M.D.   On: 09/22/2023 21:53   DG Chest Portable 1 View Result Date: 09/22/2023 EXAM: 1 VIEW XRAY OF THE CHEST 09/22/2023 07:54:00 PM COMPARISON: 01/15/2018 CLINICAL HISTORY: Weakness. Pt BIB EMS from home with complaints of generalized weakness and body aches that started this morning. Pt is afebrile. FINDINGS: LUNGS AND PLEURA: Left lower lobe airspace opacity. Blunting of left costophrenic angle. HEART AND MEDIASTINUM: Cardiomegaly. Aortic calcification. BONES AND SOFT TISSUES: No acute osseous abnormality. IMPRESSION: 1. Left lower lobe airspace opacity, suspicious for pneumonia. 2. Possible small left pleural effusion. Electronically signed by: Pinkie Pebbles MD 09/22/2023 07:59 PM  EDT RP Workstation: [id number]    ECHO pending  TELEMETRY reviewed by me [facility]) 09/23/2023 : Afib, rate 100s   Data reviewed by me [facility]) 09/23/2023: last 24h vitals tele labs imaging I/O provider notes  Principal Problem:   Atrial fibrillation with RVR (HCC) Active Problems:   BPH (benign prostatic hyperplasia)   Acute diastolic CHF (congestive heart failure) (HCC)   Obstruction of right ureteropelvic junction (UPJ) due to stone    ASSESSMENT AND PLAN:   Afib RVR, patient stopped taking his Afib meds Acute on chronic HFpEF, currently decompensated Suspect heart failure  exacerbation due to possible PNA and Afib RVR. Currently on abx per primary team. Continue diuresis, can discontinue tomorrow afternoon or evening. Cardizem  gtt currently running. Cardizem  60 mg TID PO ordered to transition from gtt to PO. Patient will likely require Cardizem  LA 120-180 mg for long term management. He is on PO Cardizem  at home, dose unknown. Currently not on heparin gtt as patient is high fall risk. I do not recommend anticoagulation for this patient. He is not on OAC at home, he only takes baby aspirin . We will continue with rate control strategy rather than converting patient as he is not receiving anticoagulation. Maintain on telemetry. Optimize lytes, K>4 and Mag>2. Echocardiogram pending.    Signed: Lovell Roe, DO 09/23/2023, 12:55 PM [facility] Cardiology

## 2023-09-23 NOTE — TOC CM/SW Note (Signed)
..  Transition of Care [facility]) - Inpatient Brief Assessment   Patient Details  Name: Dustin Townsend MRN: [id number] Date of Birth: 1933/10/23  Transition of Care [facility]) CM/SW Contact:    Edsel DELENA Fischer, LCSW Phone Number: 09/23/2023, 10:05 AM   Clinical Narrative:  TOC to handoff to heart failure team   Transition of Care Asessment:

## 2023-09-23 NOTE — H&P (Addendum)
 [place]   PATIENT NAME: Dustin Townsend    MR#:  [id number]  DATE OF BIRTH:  07-01-1933  DATE OF ADMISSION:  09/22/2023  PRIMARY CARE PHYSICIAN: Steva Clotilda DEL, NP   Patient is coming from: Home  REQUESTING/REFERRING PHYSICIAN: Ernest Shuck, MD  CHIEF COMPLAINT:   Chief Complaint  Patient presents with   Generalized Body Aches   Weakness    HISTORY OF PRESENT ILLNESS:  Dustin Townsend is a 89 y.o. male with medical history significant for CVA, GERD, hypertension and dyslipidemia, who presented to the emergency room with acute onset of worsening dyspnea with associated dry cough and orthopnea.  No bilious vomitus or hematemesis.  He has been having lower extremity edema bilaterally.  He admitted to nausea and vomiting months.  He has also been having left flank pain.  No dysuria, oliguria or hematuria otherwise.  No chest pain or palpitations.  ED Course: When the patient came to the ER, heart rate was 170 and the respiratory rate of 26 BP was 144/83 with pulse oximetry is 96% on 3 L of O2 by nasal cannula.  Labs revealed a blood glucose of 161 with a potassium of 3.4, lactic acid 2 and later 2.1 with BNP to 31.7 and normal CBC.  Respiratory panel came back negative.  High sensitivity troponin I of 12 and later 16.  Blood cultures x 2 came back negative. EKG as reviewed by me :   Imaging: Portable chest x-ray showed left lower lobe opacity suspicious for pneumonia with possible small left pleural effusion. Chest CTA and abdominal pelvic CT revealed the following: 1. No evidence of pulmonary embolism. 2. Small bilateral pleural effusions with associated bibasilar atelectasis. 3. Moderate cardiomegaly. Atherosclerotic coronary artery disease. 4. 9 mm stone over the right renal pelvis just proximal to the UPJ. No evidence of hydronephrosis. 5. Multiple bilateral renal cysts with the largest cyst measuring 5 cm over the lower pole left kidney. No further imaging  follow-up recommended. 6. Mild ascites. 7. Colonic diverticulosis. 8. Large right inguinal hernia containing several fluid-filled but nondilated small bowel loops. No evidence of bowel obstruction. Small amount of free fluid within the inguinal canal. 9. Interval treatment of patient's previously seen abdominal aortic aneurysm as there has been placement of aortoiliac stent graft which is patent. 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 10. Aortic atherosclerosis and emphysema.  The patient was given 2 g of IV Rocephin , 10 mg of IV Cardizem  twice followed by IV Cardizem  drip, 50 mcg of IV fentanyl , 15 mg of IV Toradol  and 4 mg of IV Zofran .  He will be admitted to a progressive unit bed for further evaluation and management.   PAST MEDICAL HISTORY:   Past Medical History:  Diagnosis Date   AAA (abdominal aortic aneurysm) (HCC)    CVA (cerebral vascular accident) (HCC)    Diverticulitis    GERD (gastroesophageal reflux disease)    Hyperlipemia    Hypertension     PAST SURGICAL HISTORY:   Past Surgical History:  Procedure Laterality Date   ABDOMINAL AORTIC ANEURYSM REPAIR     colectomy     HIP ARTHROPLASTY Left 01/16/2018   Procedure: ARTHROPLASTY BIPOLAR HIP (HEMIARTHROPLASTY);  Surgeon: Tobie Priest, MD;  Location: [facility] ORS;  Service: Orthopedics;  Laterality: Left;    SOCIAL HISTORY:   Social History   Tobacco Use   Smoking status: Every  Day   Smokeless tobacco: Never  Substance Use Topics   Alcohol  use: Not on file    FAMILY HISTORY:   Family History  Problem Relation Age of Onset   Hypertension Mother     DRUG ALLERGIES:  No Known Allergies  REVIEW OF SYSTEMS:   ROS As per history of present illness. All pertinent systems were reviewed above. Constitutional, HEENT, cardiovascular, respiratory, GI, GU, musculoskeletal, neuro, psychiatric, endocrine, integumentary and  hematologic systems were reviewed and are otherwise negative/unremarkable except for positive findings mentioned above in the HPI.   MEDICATIONS AT HOME:   Prior to Admission medications   Medication Sig Start Date End Date Taking? Authorizing Provider  acetaminophen  (TYLENOL ) 650 MG CR tablet Take 650 mg by mouth every 8 (eight) hours as needed. 11/04/22  Yes [provider]  senna-docusate (SENOKOT-S) 8.6-50 MG tablet Take 1 tablet by mouth 2 (two) times daily. 01/19/18  Yes Sherial Bail, MD  tamsulosin  (FLOMAX ) 0.4 MG CAPS capsule Take 1 capsule (0.4 mg total) by mouth daily. 03/20/18  Yes McGowan, Clotilda A, PA-C  clotrimazole-betamethasone (LOTRISONE) lotion Apply topically. Patient not taking: Reported on 09/22/2023    [provider]  enoxaparin  (LOVENOX ) 40 MG/0.4ML injection Inject 0.4 mLs (40 mg total) into the skin daily for 14 days. 01/19/18 02/02/18  Sherial Bail, MD  furosemide  (LASIX ) 40 MG tablet Take by mouth. 02/12/18 02/12/19  [provider]  traMADol  (ULTRAM ) 50 MG tablet Take 1 tablet (50 mg total) by mouth every 6 (six) hours as needed for moderate pain. Patient not taking: Reported on 09/22/2023 01/18/18   Verlinda Boas, PA-C      VITAL SIGNS:  Blood pressure 127/80, pulse 62, temperature 97.9 F (36.6 C), temperature source Oral, resp. rate (!) 41, height 5' 6 (1.676 m), weight 86.2 kg, SpO2 94%.  PHYSICAL EXAMINATION:  Physical Exam  GENERAL:  89 y.o.-year-old Caucasian male patient lying in the bed with no acute distress.  EYES: Pupils equal, round, reactive to light and accommodation. No scleral icterus. Extraocular muscles intact.  HEENT: Head atraumatic, normocephalic. Oropharynx and nasopharynx clear.  NECK:  Supple, no jugular venous distention. No thyroid enlargement, no tenderness.  LUNGS: Diminished bibasilar breath sounds with bibasilar rales.  No use of accessory muscles of respiration.  CARDIOVASCULAR: Irregularly  irregular tachycardic rhythm, S1, S2 normal. No murmurs, rubs, or gallops.  ABDOMEN: Soft, nondistended, nontender. Bowel sounds present. No organomegaly or mass.  EXTREMITIES: No pedal edema, cyanosis, or clubbing.  NEUROLOGIC: Cranial nerves II through XII are intact. Muscle strength 5/5 in all extremities. Sensation intact. Gait not checked.  PSYCHIATRIC: The patient is alert and oriented x 3.  Normal affect and good eye contact. SKIN: No obvious rash, lesion, or ulcer.   LABORATORY PANEL:   CBC Recent Labs  Lab 09/23/23 0414  WBC 2.1*  HGB 15.2  HCT 45.8  PLT 168   ------------------------------------------------------------------------------------------------------------------  Chemistries  Recent Labs  Lab 09/22/23 1935 09/23/23 0414  NA 138 139  K 3.4* 3.8  CL 103 103  CO2 22 24  GLUCOSE 161* 137*  BUN 20 23  CREATININE 0.96 1.01  CALCIUM 9.1 8.7*  AST 21  --   ALT 16  --   ALKPHOS 83  --   BILITOT 1.8*  --    ------------------------------------------------------------------------------------------------------------------  Cardiac Enzymes No results for input(s): TROPONINI in the last 168 hours. ------------------------------------------------------------------------------------------------------------------  RADIOLOGY:  CT Angio Chest PE W and/or Wo Contrast Result Date: 09/22/2023 CLINICAL DATA:  Generalized weakness  and body aches beginning this morning. EXAM: CT ANGIOGRAPHY CHEST CT ABDOMEN AND PELVIS WITH CONTRAST TECHNIQUE: Multidetector CT imaging of the chest was performed using the standard protocol during bolus administration of intravenous contrast. Multiplanar CT image reconstructions and MIPs were obtained to evaluate the vascular anatomy. Multidetector CT imaging of the abdomen and pelvis was performed using the standard protocol during bolus administration of intravenous contrast. RADIATION DOSE REDUCTION: This exam was performed according to the  departmental dose-optimization program which includes automated exposure control, adjustment of the mA and/or kV according to patient size and/or use of iterative reconstruction technique. CONTRAST:  OMNIPAQUE  IOHEXOL  350 MG/ML SOLN COMPARISON:  CTA abdomen 09/23/2009 FINDINGS: CTA CHEST FINDINGS Cardiovascular: Moderate cardiomegaly. Left main and 3 vessel atherosclerotic coronary artery disease. Thoracic aorta is normal in caliber. There is moderate calcified plaque throughout the thoracic aorta. Pulmonary arterial system is well opacified and demonstrates no evidence of emboli. Mediastinum/Nodes: No mediastinal or hilar adenopathy. Remaining mediastinal structures are unremarkable. Lungs/Pleura: Lungs are adequately inflated demonstrate mild centrilobular emphysematous disease. Small bilateral pleural effusions with associated bibasilar atelectasis. Airways are unremarkable. Musculoskeletal: No focal abnormality. Review of the MIP images confirms the above findings. CT ABDOMEN and PELVIS FINDINGS Hepatobiliary: Liver, gallbladder and biliary tree are unremarkable. Pancreas: Normal. Spleen: Normal. Adrenals/Urinary Tract: Adrenal glands are normal. Multiple bilateral renal cysts are present with the largest cyst measuring 5 cm over the lower pole left kidney. No further imaging follow-up recommended. There are a few calcifications over the left renal pelvis and corticomedullary junction likely vascular. Few small calcifications in the region of the right renal pelvis likely vascular. There is a 9 mm stone over the right renal pelvis just proximal to the UPJ. The ureters are within normal although the distal ureters are not visualized due to streak artifact from the adjacent left hip prosthesis. Stomach/Bowel: Stomach is within normal. Appendix is normal. There is diverticulosis of the colon. Vascular/Lymphatic: There is calcified plaque over the abdominal aorta. Interval treatment of patient's previously  seen abdominal aortic aneurysm as there is been placement of aortoiliac stent graft which is patent. 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 Remaining vascular structures are unremarkable. No evidence of adenopathy. Reproductive: Prostate is unremarkable although partially obscured by streak artifact. Other: Large right inguinal hernia containing several fluid-filled but nondilated small bowel loops. Small amount of free fluid within the inguinal canal. Mild ascites. Musculoskeletal: No focal abnormality. Review of the MIP images confirms the above findings. IMPRESSION: 1. No evidence of pulmonary embolism. 2. Small bilateral pleural effusions with associated bibasilar atelectasis. 3. Moderate cardiomegaly. Atherosclerotic coronary artery disease. 4. 9 mm stone over the right renal pelvis just proximal to the UPJ. No evidence of hydronephrosis. 5. Multiple bilateral renal cysts with the largest cyst measuring 5 cm over the lower pole left kidney. No further imaging follow-up recommended. 6. Mild ascites. 7. Colonic diverticulosis. 8. Large right inguinal hernia containing several fluid-filled but nondilated small bowel loops. No evidence of bowel obstruction. Small amount of free fluid within the inguinal canal. 9. Interval treatment of patient's previously seen abdominal aortic aneurysm as there has been placement of aortoiliac stent graft which is patent. 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 10. Aortic atherosclerosis. Aortic Atherosclerosis (ICD10-I70.0) and Emphysema (ICD10-J43.9). Electronically Signed   By: Toribio Agreste M.D.  On: 09/22/2023 21:53   CT ABDOMEN PELVIS W CONTRAST Result Date: 09/22/2023 CLINICAL DATA:  Generalized weakness and body aches beginning this morning. EXAM: CT ANGIOGRAPHY CHEST CT ABDOMEN AND PELVIS WITH  CONTRAST TECHNIQUE: Multidetector CT imaging of the chest was performed using the standard protocol during bolus administration of intravenous contrast. Multiplanar CT image reconstructions and MIPs were obtained to evaluate the vascular anatomy. Multidetector CT imaging of the abdomen and pelvis was performed using the standard protocol during bolus administration of intravenous contrast. RADIATION DOSE REDUCTION: This exam was performed according to the departmental dose-optimization program which includes automated exposure control, adjustment of the mA and/or kV according to patient size and/or use of iterative reconstruction technique. CONTRAST:  OMNIPAQUE  IOHEXOL  350 MG/ML SOLN COMPARISON:  CTA abdomen 09/23/2009 FINDINGS: CTA CHEST FINDINGS Cardiovascular: Moderate cardiomegaly. Left main and 3 vessel atherosclerotic coronary artery disease. Thoracic aorta is normal in caliber. There is moderate calcified plaque throughout the thoracic aorta. Pulmonary arterial system is well opacified and demonstrates no evidence of emboli. Mediastinum/Nodes: No mediastinal or hilar adenopathy. Remaining mediastinal structures are unremarkable. Lungs/Pleura: Lungs are adequately inflated demonstrate mild centrilobular emphysematous disease. Small bilateral pleural effusions with associated bibasilar atelectasis. Airways are unremarkable. Musculoskeletal: No focal abnormality. Review of the MIP images confirms the above findings. CT ABDOMEN and PELVIS FINDINGS Hepatobiliary: Liver, gallbladder and biliary tree are unremarkable. Pancreas: Normal. Spleen: Normal. Adrenals/Urinary Tract: Adrenal glands are normal. Multiple bilateral renal cysts are present with the largest cyst measuring 5 cm over the lower pole left kidney. No further imaging follow-up recommended. There are a few calcifications over the left renal pelvis and corticomedullary junction likely vascular. Few small calcifications in the region of the right  renal pelvis likely vascular. There is a 9 mm stone over the right renal pelvis just proximal to the UPJ. The ureters are within normal although the distal ureters are not visualized due to streak artifact from the adjacent left hip prosthesis. Stomach/Bowel: Stomach is within normal. Appendix is normal. There is diverticulosis of the colon. Vascular/Lymphatic: There is calcified plaque over the abdominal aorta. Interval treatment of patient's previously seen abdominal aortic aneurysm as there is been placement of aortoiliac stent graft which is patent. 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 Remaining vascular structures are unremarkable. No evidence of adenopathy. Reproductive: Prostate is unremarkable although partially obscured by streak artifact. Other: Large right inguinal hernia containing several fluid-filled but nondilated small bowel loops. Small amount of free fluid within the inguinal canal. Mild ascites. Musculoskeletal: No focal abnormality. Review of the MIP images confirms the above findings. IMPRESSION: 1. No evidence of pulmonary embolism. 2. Small bilateral pleural effusions with associated bibasilar atelectasis. 3. Moderate cardiomegaly. Atherosclerotic coronary artery disease. 4. 9 mm stone over the right renal pelvis just proximal to the UPJ. No evidence of hydronephrosis. 5. Multiple bilateral renal cysts with the largest cyst measuring 5 cm over the lower pole left kidney. No further imaging follow-up recommended. 6. Mild ascites. 7. Colonic diverticulosis. 8. Large right inguinal hernia containing several fluid-filled but nondilated small bowel loops. No evidence of bowel obstruction. Small amount of free fluid within the inguinal canal. 9. Interval treatment of patient's previously seen abdominal aortic aneurysm as there has been placement of aortoiliac stent graft which is patent. Aneurysmal dilatation of  the right common iliac artery measuring 2.8 cm at the level of the distal aspect of the stent graft.  10. Aortic atherosclerosis. Aortic Atherosclerosis (ICD10-I70.0) and Emphysema (ICD10-J43.9). Electronically Signed   By: Toribio Agreste M.D.   On: 09/22/2023 21:53   DG Chest Portable 1 View Result Date: 09/22/2023 EXAM: 1 VIEW XRAY OF THE CHEST 09/22/2023 07:54:00 PM COMPARISON: 01/15/2018 CLINICAL HISTORY: Weakness. Pt BIB EMS from home with complaints of generalized weakness and body aches that started this morning. Pt is afebrile. FINDINGS: LUNGS AND PLEURA: Left lower lobe airspace opacity. Blunting of left costophrenic angle. HEART AND MEDIASTINUM: Cardiomegaly. Aortic calcification. BONES AND SOFT TISSUES: No acute osseous abnormality. IMPRESSION: 1. Left lower lobe airspace opacity, suspicious for pneumonia. 2. Possible small left pleural effusion. Electronically signed by: Pinkie Pebbles MD 09/22/2023 07:59 PM EDT RP Workstation: [id number]      IMPRESSION AND PLAN:  Assessment and Plan: * Atrial fibrillation with RVR (HCC) - The patient will be admitted to a progressive unit bed. - Will continue IV Cardizem  drip. - Will monitor heart rate. - 2D echo and cardiology consult to be obtained. - I believe that the patient is a fall risk for anticoagulation at this time  Acute diastolic CHF (congestive heart failure) (HCC) - The patient with diuresis with IV Lasix . - Will follow serial troponins. - 2D echo and cardiology consult will be obtained as mentioned above. - Notified Dr. Custovic about the patient.  Obstruction of right ureteropelvic junction (UPJ) due to stone - Urology consult to be obtained for follow-up. - Dr. Cam was notified about the patient.  BPH (benign prostatic hyperplasia) - Will continue Flomax .   DVT prophylaxis: Lovenox .  Advanced Care Planning:  Code Status: full code.  Family Communication:  The plan of care was discussed in details with the patient  (and family). I answered all questions. The patient agreed to proceed with the above mentioned plan. Further management will depend upon hospital course. Disposition Plan: Back to previous home environment Consults called: Cardiology All the records are reviewed and case discussed with ED provider.  Status is: Inpatient  At the time of the admission, it appears that the appropriate admission status for this patient is inpatient.  This is judged to be reasonable and necessary in order to provide the required intensity of service to ensure the patient's safety given the presenting symptoms, physical exam findings and initial radiographic and laboratory data in the context of comorbid conditions.  The patient requires inpatient status due to high intensity of service, high risk of further deterioration and high frequency of surveillance required.  I certify that at the time of admission, it is my clinical judgment that the patient will require inpatient hospital care extending more than 2 midnights.                            Dispo: The patient is from: Home              Anticipated d/c is to: Home              Patient currently is not medically stable to d/c.              Difficult to place patient: No  [place]ison DELENA Peaches M.D on 09/23/2023 at 5:28 AM  [facility]   From 7 PM-7 AM, contact night-coverage [email or web address]  CC: Primary care physician; Steva Clotilda DEL, NP

## 2023-09-23 NOTE — Assessment & Plan Note (Addendum)
-   Cardiology consult and 2D echo will be obtained.

## 2023-09-23 NOTE — Assessment & Plan Note (Signed)
 UA concerning for UTI. Unfortunately urine cultures were not collected at the time of admission, preliminary blood cultures negative. Urine cultures ordered as add-on but lab does not has enough sample so it was recollected. - Poor utility as patient was already on antibiotic -Continue with ceftriaxone 

## 2023-09-23 NOTE — ED Notes (Addendum)
 Pt c/o right shoulder and abdominal pain at this time.

## 2023-09-23 NOTE — ED Notes (Signed)
 BP cuff switched arms due to low BP readings. Patient up in bed and alert about to eat dinner.

## 2023-09-23 NOTE — Hospital Course (Addendum)
 Partly taken from H&P.  Dustin Townsend is a 89 y.o. male with medical history significant for CVA, GERD, hypertension and dyslipidemia, who presented to the emergency room with acute onset of worsening dyspnea with associated dry cough and orthopnea. He has been having lower extremity edema bilaterally.   On presentation he was found to be in A-fib with RVR, saturating 96% on 3 L of oxygen.  Labs with blood glucose of 161, potassium 3.2, lactic acid 2>>2.1, BNP 31.7.  Respiratory panel negative, troponin 12>>16. Chest x-ray with left lower lobe opacity suspicious for pneumonia with a possible small left pleural effusion. CTA chest, abdomen and pelvis with no evidence of PE, small bilateral pleural effusions with associated bibasilar atelectasis.  Moderate cardiomegaly.  Also noted and nonobstructing 9 mm stone in the right renal pelvis, no hydronephrosis.  Multiple bilateral renal cysts with no further imaging recommendations.  Mild ascites and a large right inguinal hernia containing several fluid-filled but nondilated small bowel loops, no evidence of bowel obstruction. Also noted interval treatment of patient's prior abdominal aortic aneurysm with patent stent graft.  Patient was started on Cardizem  infusion.  Cardiology was consulted.  Echocardiogram ordered. Also concern of UTI and started on ceftriaxone .  9/20: Heart rate in low 100s, on 3 L of oxygen.  Urology is suggesting outpatient follow-up for nonobstructing renal stone.  UA looks infected with positive nitrites, leukocytes, hematuria and bacteria.  Ordered urine culture as add-on, apparently did not had enough sample in the lab and they ordered recollection, patient is already on antibiotics.  Preliminary blood culture negative. Urology was again consulted as patient was having right-sided abdominal pain and family was concerned and requesting urologic evaluation for kidney stone.  9/21: Still mildly tachycardic, p.o. intake seems poor.   Echocardiogram pending.  Concern of some abdominal distention by family, bladder scan with about 200 mL of urine, also ordered KUB, last BM was on 9/19.  No nausea or vomiting or significant pain today.

## 2023-09-23 NOTE — Assessment & Plan Note (Addendum)
 Heart rate with some improvement, cardiology is going to transition him from infusion to p.o. Cardizem . Not a candidate for anticoagulation due to increased fall risk. Echocardiogram ordered-pending -Appreciate cardiology help

## 2023-09-23 NOTE — ED Notes (Signed)
 CCMD called for monitoring

## 2023-09-23 NOTE — Assessment & Plan Note (Addendum)
-   Urology consult to be obtained for follow-up. - Dr. Cam was notified about the patient. - Initial recommendations were for outpatient follow-up at family requested reevaluation as he was having right sided abdominal pain.

## 2023-09-24 ENCOUNTER — Inpatient Hospital Stay

## 2023-09-24 DIAGNOSIS — N390 Urinary tract infection, site not specified: Secondary | ICD-10-CM | POA: Diagnosis not present

## 2023-09-24 DIAGNOSIS — N201 Calculus of ureter: Secondary | ICD-10-CM | POA: Diagnosis not present

## 2023-09-24 DIAGNOSIS — R319 Hematuria, unspecified: Secondary | ICD-10-CM

## 2023-09-24 DIAGNOSIS — I4891 Unspecified atrial fibrillation: Secondary | ICD-10-CM | POA: Diagnosis not present

## 2023-09-24 DIAGNOSIS — I5031 Acute diastolic (congestive) heart failure: Secondary | ICD-10-CM | POA: Diagnosis not present

## 2023-09-24 LAB — BLOOD GAS, ARTERIAL
Acid-base deficit: 0.8 mmol/L (ref 0.0–2.0)
Bicarbonate: 23.5 mmol/L (ref 20.0–28.0)
O2 Content: 5 L/min
O2 Saturation: 94.9 %
Patient temperature: 37
pCO2 arterial: 37 mmHg (ref 32–48)
pH, Arterial: 7.41 (ref 7.35–7.45)
pO2, Arterial: 65 mmHg — ABNORMAL LOW (ref 83–108)

## 2023-09-24 LAB — BASIC METABOLIC PANEL WITH GFR
Anion gap: 13 (ref 5–15)
BUN: 60 mg/dL — ABNORMAL HIGH (ref 8–23)
CO2: 22 mmol/L (ref 22–32)
Calcium: 9 mg/dL (ref 8.9–10.3)
Chloride: 97 mmol/L — ABNORMAL LOW (ref 98–111)
Creatinine, Ser: 2.86 mg/dL — ABNORMAL HIGH (ref 0.61–1.24)
GFR, Estimated: 21 mL/min — ABNORMAL LOW (ref >60)
Glucose, Bld: 110 mg/dL — ABNORMAL HIGH (ref 70–99)
Potassium: 4.7 mmol/L (ref 3.5–5.1)
Sodium: 133 mmol/L — ABNORMAL LOW (ref 135–145)

## 2023-09-24 MED ORDER — IPRATROPIUM-ALBUTEROL 0.5-2.5 (3) MG/3ML IN SOLN: 3.0000 mL | Freq: Four times a day (QID) | RESPIRATORY_TRACT | Status: DC

## 2023-09-24 MED ORDER — DIGOXIN 0.25 MG/ML IJ SOLN
0.2500 mg | Freq: Once | INTRAMUSCULAR | Status: AC
  Administered 2023-09-24: 0.25 mg via INTRAVENOUS
  Filled 2023-09-24: qty 2

## 2023-09-24 MED ORDER — IPRATROPIUM-ALBUTEROL 0.5-2.5 (3) MG/3ML IN SOLN
3.0000 mL | Freq: Four times a day (QID) | RESPIRATORY_TRACT | Status: DC
  Administered 2023-09-24: 3 mL via RESPIRATORY_TRACT
  Filled 2023-09-24 (×3): qty 3

## 2023-09-24 MED ORDER — ALUM & MAG HYDROXIDE-SIMETH 200-200-20 MG/5ML PO SUSP
30.0000 mL | ORAL | Status: DC | PRN
  Administered 2023-09-24: 30 mL via ORAL
  Filled 2023-09-24: qty 30

## 2023-09-24 MED ORDER — FUROSEMIDE 10 MG/ML IJ SOLN
20.0000 mg | Freq: Once | INTRAMUSCULAR | Status: DC
  Filled 2023-09-24: qty 2

## 2023-09-24 MED ORDER — LACTATED RINGERS IV SOLN: INTRAVENOUS | Status: AC

## 2023-09-24 MED ORDER — DILTIAZEM HCL ER COATED BEADS 120 MG PO CP24
120.0000 mg | ORAL_CAPSULE | Freq: Every day | ORAL | Status: DC
  Administered 2023-09-24: 120 mg via ORAL
  Filled 2023-09-24 (×2): qty 1

## 2023-09-24 NOTE — Progress Notes (Signed)
 Progress Note   Patient: Dustin Townsend FMW:[id number] DOB: May 14, 1933 DOA: 09/22/2023     2 DOS: the patient was seen and examined on 09/24/2023   Brief hospital course: Partly taken from H&P.  [name] is a 89 y.o. male with medical history significant for CVA, GERD, hypertension and dyslipidemia, who presented to the emergency room with acute onset of worsening dyspnea with associated dry cough and orthopnea. He has been having lower extremity edema bilaterally.   On presentation he was found to be in A-fib with RVR, saturating 96% on 3 L of oxygen.  Labs with blood glucose of 161, potassium 3.2, lactic acid 2>>2.1, BNP 31.7.  Respiratory panel negative, troponin 12>>16. Chest x-ray with left lower lobe opacity suspicious for pneumonia with a possible small left pleural effusion. CTA chest, abdomen and pelvis with no evidence of PE, small bilateral pleural effusions with associated bibasilar atelectasis.  Moderate cardiomegaly.  Also noted and nonobstructing 9 mm stone in the right renal pelvis, no hydronephrosis.  Multiple bilateral renal cysts with no further imaging recommendations.  Mild ascites and a large right inguinal hernia containing several fluid-filled but nondilated small bowel loops, no evidence of bowel obstruction. Also noted interval treatment of patient's prior abdominal aortic aneurysm with patent stent graft.  Patient was started on Cardizem  infusion.  Cardiology was consulted.  Echocardiogram ordered. Also concern of UTI and started on ceftriaxone .  9/20: Heart rate in low 100s, on 3 L of oxygen.  Urology is suggesting outpatient follow-up for nonobstructing renal stone.  UA looks infected with positive nitrites, leukocytes, hematuria and bacteria.  Ordered urine culture as add-on, apparently did not had enough sample in the lab and they ordered recollection, patient is already on antibiotics.  Preliminary blood culture negative. Urology was again consulted as patient  was having right-sided abdominal pain and family was concerned and requesting urologic evaluation for kidney stone.  9/21: Still mildly tachycardic, p.o. intake seems poor.  Echocardiogram pending.  Concern of some abdominal distention by family, bladder scan with about 200 mL of urine, also ordered KUB, last BM was on 9/19.  No nausea or vomiting or significant pain today.  Assessment and Plan: * Atrial fibrillation with RVR (HCC) Heart rate with some improvement, cardiology is going to transition him from infusion to p.o. Cardizem . Not a candidate for anticoagulation due to increased fall risk. Echocardiogram ordered-pending -Appreciate cardiology help  Acute diastolic CHF (congestive heart failure) [facility]) Cardiology is on board. - Holding further IV Lasix  today as clinically appears euvolemic and awaiting today's BMP -Daily weight and BMP -Strict intake and output -echocardiogram pending  Obstruction of right ureteropelvic junction (UPJ) due to stone - Urology consult to be obtained for follow-up. - Dr. Cam was notified about the patient. - Initial recommendations were for outpatient follow-up at family requested reevaluation as he was having right sided abdominal pain.  UTI (urinary tract infection) UA concerning for UTI. Unfortunately urine cultures were not collected at the time of admission, preliminary blood cultures negative. Urine cultures ordered as add-on but lab does not has enough sample so it was recollected. - Poor utility as patient was already on antibiotic -Continue with ceftriaxone   BPH (benign prostatic hyperplasia) - Will continue Flomax .  Subjective: Patient was seen and examined today.  Still appears lethargic, denies any significant pain.  Physical Exam: Vitals:   09/23/23 2303 09/24/23 0435 09/24/23 0739 09/24/23 1152  BP: (!) 124/106 (!) 149/129 (!) 94/59 99/64  Pulse: (!) 52 (!) 107  99 97  Resp: 20 20 20 20   Temp: 98.7 F (37.1 C) 98.7 F (37.1  C) 98.6 F (37 C) 98 F (36.7 C)  TempSrc:   Oral Oral  SpO2: 91% 90% 90% 90%  Weight: 83.9 kg     Height: 5' 6 (1.676 m)      General.  Chronically ill-appearing, frail elderly man, in no acute distress. Pulmonary.  Lungs clear bilaterally, normal respiratory effort. CV.  Irregularly irregular Abdomen.  Soft, nontender, mildly distended, BS positive. CNS.  Alert and oriented .  No focal neurologic deficit. Extremities.  No edema,  pulses intact and symmetrical. Psychiatry.  Judgment and insight appears normal.   Data Reviewed: Prior data reviewed  Family Communication: Talked with son on phone.  Disposition: Status is: Inpatient Remains inpatient appropriate because: Severity of illness  Planned Discharge Destination: Home with Home Health  DVT prophylaxis.  Lovenox  Time spent: 50 minutes  This record has been created using Conservation officer, historic buildings. Errors have been sought and corrected,but may not always be located. Such creation errors do not reflect on the standard of care.   Author: Amaryllis Dare, MD 09/24/2023 1:18 PM  For on call review [email or web address].

## 2023-09-24 NOTE — Assessment & Plan Note (Signed)
 Heart rate with some improvement, cardiology is going to transition him from infusion to p.o. Cardizem . Not a candidate for anticoagulation due to increased fall risk. Echocardiogram ordered-pending -Appreciate cardiology help

## 2023-09-24 NOTE — Progress Notes (Signed)
 [facility] CLINIC CARDIOLOGY PROGRESS NOTE   Patient ID: Dustin Townsend MRN: [id number] DOB/AGE: June 10, 1933 89 y.o.  Admit date: 09/22/2023 Referring Physician Dr Caleen Primary Physician Steva, Clotilda DEL, NP Primary Cardiologist none Reason for Consultation Afib RVR & AECHF   HPI: Dustin Townsend is a 89 y.o. male  with a past medical history of HTN, HLD, Afib not on OAC and previous CVA who presented to the ED on 09/22/2023 for dyspnea. Cardiology was placed on consultation for Afib RVR and AECHF. Patient is currently being treated for possible pneumonia and kidney stones. He is a known high fall risk and therefore, not a great candidate for anticoagulation. He is not on anticoagulation at home, only takes baby aspirin .   Interval History:  -Patient seen and examined this morning. Denies chest pain, shortness of breath, palpitations, diaphoresis, syncope. He apparently threw out his cardizem  prescription at home and that is why he went into Afib RVR. He is on Cardizem  XL 120 mg daily at home.   Review of systems complete and found to be negative unless listed above    Vitals:   09/23/23 2303 09/24/23 0435 09/24/23 0739 09/24/23 1152  BP: (!) 124/106 (!) 149/129 (!) 94/59 99/64  Pulse: (!) 52 (!) 107 99 97  Resp: 20 20 20 20   Temp: 98.7 F (37.1 C) 98.7 F (37.1 C) 98.6 F (37 C) 98 F (36.7 C)  TempSrc:   Oral Oral  SpO2: 91% 90% 90% 90%  Weight: 83.9 kg     Height: 5' 6 (1.676 m)        Intake/Output Summary (Last 24 hours) at 09/24/2023 1316 Last data filed at 09/24/2023 1047 Gross per 24 hour  Intake 580 ml  Output --  Net 580 ml     PHYSICAL EXAM General: awake, well nourished, in no acute distress. HEENT: Normocephalic and atraumatic. Neck: No JVD.  Lungs: Normal respiratory effort.  Heart: HRRR. Normal S1 and S2 without gallops or murmurs.  Abdomen: Non-distended appearing.  Msk: Normal strength and tone for age. Extremities: Warm and well perfused. No clubbing,  cyanosis. 1+ edema.  Neuro: Alert and oriented X 3. Psych: Answers questions appropriately.    LABS: Basic Metabolic Panel: Recent Labs    09/22/23 1935 09/23/23 0414  NA 138 139  K 3.4* 3.8  CL 103 103  CO2 22 24  GLUCOSE 161* 137*  BUN 20 23  CREATININE 0.96 1.01  CALCIUM 9.1 8.7*   Liver Function Tests: Recent Labs    09/22/23 1935  AST 21  ALT 16  ALKPHOS 83  BILITOT 1.8*  PROT 6.8  ALBUMIN 3.7   Recent Labs    09/22/23 1935  LIPASE 23   CBC: Recent Labs    09/22/23 1935 09/23/23 0414  WBC 5.8 2.1*  NEUTROABS 4.6  --   HGB 14.8 15.2  HCT 43.5 45.8  MCV 98.0 98.3  PLT 186 168   Cardiac Enzymes: Recent Labs    09/22/23 1935 09/22/23 2219  TROPONINIHS 12 16   BNP: Recent Labs    09/22/23 1935  BNP 231.7*   D-Dimer: No results for input(s): DDIMER in the last 72 hours. Hemoglobin A1C: No results for input(s): HGBA1C in the last 72 hours. Fasting Lipid Panel: No results for input(s): CHOL, HDL, LDLCALC, TRIG, CHOLHDL, LDLDIRECT in the last 72 hours. Thyroid Function Tests: No results for input(s): TSH, T4TOTAL, T3FREE, THYROIDAB in the last 72 hours.  Invalid input(s): FREET3 Anemia Panel: No results for  input(s): VITAMINB12, FOLATE, FERRITIN, TIBC, IRON, RETICCTPCT in the last 72 hours.  CT Angio Chest PE W and/or Wo Contrast Result Date: 09/22/2023 CLINICAL DATA:  Generalized weakness and body aches beginning this morning. EXAM: CT ANGIOGRAPHY CHEST CT ABDOMEN AND PELVIS WITH CONTRAST TECHNIQUE: Multidetector CT imaging of the chest was performed using the standard protocol during bolus administration of intravenous contrast. Multiplanar CT image reconstructions and MIPs were obtained to evaluate the vascular anatomy. Multidetector CT imaging of the abdomen and pelvis was performed using the standard protocol during bolus administration of intravenous contrast. RADIATION DOSE REDUCTION: This exam was  performed according to the departmental dose-optimization program which includes automated exposure control, adjustment of the mA and/or kV according to patient size and/or use of iterative reconstruction technique. CONTRAST:  OMNIPAQUE  IOHEXOL  350 MG/ML SOLN COMPARISON:  CTA abdomen 09/23/2009 FINDINGS: CTA CHEST FINDINGS Cardiovascular: Moderate cardiomegaly. Left main and 3 vessel atherosclerotic coronary artery disease. Thoracic aorta is normal in caliber. There is moderate calcified plaque throughout the thoracic aorta. Pulmonary arterial system is well opacified and demonstrates no evidence of emboli. Mediastinum/Nodes: No mediastinal or hilar adenopathy. Remaining mediastinal structures are unremarkable. Lungs/Pleura: Lungs are adequately inflated demonstrate mild centrilobular emphysematous disease. Small bilateral pleural effusions with associated bibasilar atelectasis. Airways are unremarkable. Musculoskeletal: No focal abnormality. Review of the MIP images confirms the above findings. CT ABDOMEN and PELVIS FINDINGS Hepatobiliary: Liver, gallbladder and biliary tree are unremarkable. Pancreas: Normal. Spleen: Normal. Adrenals/Urinary Tract: Adrenal glands are normal. Multiple bilateral renal cysts are present with the largest cyst measuring 5 cm over the lower pole left kidney. No further imaging follow-up recommended. There are a few calcifications over the left renal pelvis and corticomedullary junction likely vascular. Few small calcifications in the region of the right renal pelvis likely vascular. There is a 9 mm stone over the right renal pelvis just proximal to the UPJ. The ureters are within normal although the distal ureters are not visualized due to streak artifact from the adjacent left hip prosthesis. Stomach/Bowel: Stomach is within normal. Appendix is normal. There is diverticulosis of the colon. Vascular/Lymphatic: There is calcified plaque over the abdominal aorta. Interval treatment  of patient's previously seen abdominal aortic aneurysm as there is been placement of aortoiliac stent graft which is patent. 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 Remaining vascular structures are unremarkable. No evidence of adenopathy. Reproductive: Prostate is unremarkable although partially obscured by streak artifact. Other: Large right inguinal hernia containing several fluid-filled but nondilated small bowel loops. Small amount of free fluid within the inguinal canal. Mild ascites. Musculoskeletal: No focal abnormality. Review of the MIP images confirms the above findings. IMPRESSION: 1. No evidence of pulmonary embolism. 2. Small bilateral pleural effusions with associated bibasilar atelectasis. 3. Moderate cardiomegaly. Atherosclerotic coronary artery disease. 4. 9 mm stone over the right renal pelvis just proximal to the UPJ. No evidence of hydronephrosis. 5. Multiple bilateral renal cysts with the largest cyst measuring 5 cm over the lower pole left kidney. No further imaging follow-up recommended. 6. Mild ascites. 7. Colonic diverticulosis. 8. Large right inguinal hernia containing several fluid-filled but nondilated small bowel loops. No evidence of bowel obstruction. Small amount of free fluid within the inguinal canal. 9. Interval treatment of patient's previously seen abdominal aortic aneurysm as there has been placement of aortoiliac stent graft which is patent. 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 10. Aortic atherosclerosis. Aortic Atherosclerosis (ICD10-I70.0) and Emphysema (ICD10-J43.9). Electronically Signed   By: Toribio Agreste M.D.   On: 09/22/2023 21:53   CT ABDOMEN PELVIS W CONTRAST Result Date: 09/22/2023 CLINICAL DATA:  Generalized weakness and body aches beginning this morning. EXAM: CT ANGIOGRAPHY CHEST CT  ABDOMEN AND PELVIS WITH CONTRAST TECHNIQUE: Multidetector CT imaging of the chest was performed using the standard protocol during bolus administration of intravenous contrast. Multiplanar CT image reconstructions and MIPs were obtained to evaluate the vascular anatomy. Multidetector CT imaging of the abdomen and pelvis was performed using the standard protocol during bolus administration of intravenous contrast. RADIATION DOSE REDUCTION: This exam was performed according to the departmental dose-optimization program which includes automated exposure control, adjustment of the mA and/or kV according to patient size and/or use of iterative reconstruction technique. CONTRAST:  OMNIPAQUE  IOHEXOL  350 MG/ML SOLN COMPARISON:  CTA abdomen 09/23/2009 FINDINGS: CTA CHEST FINDINGS Cardiovascular: Moderate cardiomegaly. Left main and 3 vessel atherosclerotic coronary artery disease. Thoracic aorta is normal in caliber. There is moderate calcified plaque throughout the thoracic aorta. Pulmonary arterial system is well opacified and demonstrates no evidence of emboli. Mediastinum/Nodes: No mediastinal or hilar adenopathy. Remaining mediastinal structures are unremarkable. Lungs/Pleura: Lungs are adequately inflated demonstrate mild centrilobular emphysematous disease. Small bilateral pleural effusions with associated bibasilar atelectasis. Airways are unremarkable. Musculoskeletal: No focal abnormality. Review of the MIP images confirms the above findings. CT ABDOMEN and PELVIS FINDINGS Hepatobiliary: Liver, gallbladder and biliary tree are unremarkable. Pancreas: Normal. Spleen: Normal. Adrenals/Urinary Tract: Adrenal glands are normal. Multiple bilateral renal cysts are present with the largest cyst measuring 5 cm over the lower pole left kidney. No further imaging follow-up recommended. There are a few calcifications over the left renal pelvis and corticomedullary junction likely vascular. Few small calcifications in  the region of the right renal pelvis likely vascular. There is a 9 mm stone over the right renal pelvis just proximal to the UPJ. The ureters are within normal although the distal ureters are not visualized due to streak artifact from the adjacent left hip prosthesis. Stomach/Bowel: Stomach is within normal. Appendix is normal. There is diverticulosis of the colon. Vascular/Lymphatic: There is calcified plaque over the abdominal aorta. Interval treatment of patient's previously seen abdominal aortic aneurysm as there is been placement of aortoiliac stent graft which is patent. 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 Remaining vascular structures are unremarkable. No evidence of adenopathy. Reproductive: Prostate is unremarkable although partially obscured by streak artifact. Other: Large right inguinal hernia containing several fluid-filled but nondilated small bowel loops. Small amount of free fluid within the inguinal canal. Mild ascites. Musculoskeletal: No focal abnormality. Review of the MIP images confirms the above findings. IMPRESSION: 1. No evidence of pulmonary embolism. 2. Small bilateral pleural effusions with associated bibasilar atelectasis. 3. Moderate cardiomegaly. Atherosclerotic coronary artery disease. 4. 9 mm stone over the right renal pelvis just proximal to the UPJ. No evidence of hydronephrosis. 5. Multiple bilateral renal cysts with the largest cyst measuring 5 cm over the lower pole left kidney. No further imaging follow-up recommended. 6. Mild ascites. 7. Colonic diverticulosis. 8. Large right inguinal hernia containing several fluid-filled but nondilated small bowel loops. No evidence of bowel obstruction. Small amount of free fluid within the inguinal canal. 9. Interval treatment of patient's previously seen abdominal aortic aneurysm as there has been placement  of aortoiliac stent graft which is patent.  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 10. Aortic atherosclerosis. Aortic Atherosclerosis (ICD10-I70.0) and Emphysema (ICD10-J43.9). Electronically Signed   By: Toribio Agreste M.D.   On: 09/22/2023 21:53   DG Chest Portable 1 View Result Date: 09/22/2023 EXAM: 1 VIEW XRAY OF THE CHEST 09/22/2023 07:54:00 PM COMPARISON: 01/15/2018 CLINICAL HISTORY: Weakness. Pt BIB EMS from home with complaints of generalized weakness and body aches that started this morning. Pt is afebrile. FINDINGS: LUNGS AND PLEURA: Left lower lobe airspace opacity. Blunting of left costophrenic angle. HEART AND MEDIASTINUM: Cardiomegaly. Aortic calcification. BONES AND SOFT TISSUES: No acute osseous abnormality. IMPRESSION: 1. Left lower lobe airspace opacity, suspicious for pneumonia. 2. Possible small left pleural effusion. Electronically signed by: Pinkie Pebbles MD 09/22/2023 07:59 PM EDT RP Workstation: [id number]     ECHO pending  TELEMETRY reviewed by me 09/24/23: Afib cvr   ASSESSMENT AND PLAN:  Principal Problem:   Atrial fibrillation with RVR (HCC) Active Problems:   BPH (benign prostatic hyperplasia)   Acute diastolic CHF (congestive heart failure) (HCC)   Obstruction of right ureteropelvic junction (UPJ) due to stone   UTI (urinary tract infection)   Kidney stone   Afib RVR, patient stopped taking his Afib meds Acute on chronic HFpEF, currently decompensated Suspect heart failure exacerbation due to possible PNA and Afib RVR. Currently on abx per primary team. Continue diuresis, can discontinue this evening. Starting Cardizem  LA 120 mg for long term management. He is on PO Cardizem  at home, 120 mg but he threw his script out and has not taken it recently. Currently not on heparin gtt as patient is high fall risk. I do not recommend anticoagulation for this patient. He is not on OAC at home, he only takes baby  aspirin . We will continue with rate control strategy rather than converting patient as he is not receiving anticoagulation. Maintain on telemetry. Optimize lytes, K>4 and Mag>2. Echocardiogram pending.  Maitland Muhlbauer, DO 09/24/2023, 1:16 PM [facility] Cardiology

## 2023-09-24 NOTE — Assessment & Plan Note (Addendum)
 Cardiology is on board. - Holding further IV Lasix  today as clinically appears euvolemic and awaiting today's BMP -Daily weight and BMP -Strict intake and output -echocardiogram pending

## 2023-09-24 NOTE — Progress Notes (Signed)
 STAT abg done, results are within normal range with slight hypoxia. Inform covering MD, Dr. Lawence via secure chat. Although slight increase work of breathing noted, no indication for BIPAP at this time.  Awaiting further orders

## 2023-09-24 NOTE — Plan of Care (Signed)
  Problem: Clinical Measurements: Goal: Respiratory complications will improve Outcome: Progressing Goal: Cardiovascular complication will be avoided Outcome: Progressing   Problem: Activity: Goal: Risk for activity intolerance will decrease Outcome: Progressing   Problem: Nutrition: Goal: Adequate nutrition will be maintained Outcome: Progressing   Problem: Coping: Goal: Level of anxiety will decrease Outcome: Progressing   

## 2023-09-25 ENCOUNTER — Inpatient Hospital Stay: Admit: 2023-09-25

## 2023-09-25 ENCOUNTER — Inpatient Hospital Stay

## 2023-09-25 DIAGNOSIS — J9601 Acute respiratory failure with hypoxia: Secondary | ICD-10-CM | POA: Diagnosis not present

## 2023-09-25 DIAGNOSIS — I5033 Acute on chronic diastolic (congestive) heart failure: Secondary | ICD-10-CM

## 2023-09-25 DIAGNOSIS — A419 Sepsis, unspecified organism: Principal | ICD-10-CM

## 2023-09-25 DIAGNOSIS — J690 Pneumonitis due to inhalation of food and vomit: Secondary | ICD-10-CM | POA: Diagnosis not present

## 2023-09-25 DIAGNOSIS — E871 Hypo-osmolality and hyponatremia: Secondary | ICD-10-CM

## 2023-09-25 DIAGNOSIS — N179 Acute kidney failure, unspecified: Secondary | ICD-10-CM

## 2023-09-25 LAB — BASIC METABOLIC PANEL WITH GFR
Anion gap: 16 — ABNORMAL HIGH (ref 5–15)
BUN: 70 mg/dL — ABNORMAL HIGH (ref 8–23)
CO2: 24 mmol/L (ref 22–32)
Calcium: 9 mg/dL (ref 8.9–10.3)
Chloride: 92 mmol/L — ABNORMAL LOW (ref 98–111)
Creatinine, Ser: 3.65 mg/dL — ABNORMAL HIGH (ref 0.61–1.24)
GFR, Estimated: 15 mL/min — ABNORMAL LOW (ref >60)
Glucose, Bld: 115 mg/dL — ABNORMAL HIGH (ref 70–99)
Potassium: 4.8 mmol/L (ref 3.5–5.1)
Sodium: 132 mmol/L — ABNORMAL LOW (ref 135–145)

## 2023-09-25 LAB — URINALYSIS, COMPLETE (UACMP) WITH MICROSCOPIC
Bilirubin Urine: NEGATIVE
Glucose, UA: 50 mg/dL — AB
Ketones, ur: NEGATIVE mg/dL
Nitrite: NEGATIVE
Protein, ur: 100 mg/dL — AB
RBC / HPF: >50 RBC/hpf (ref 0–5)
Specific Gravity, Urine: 1.026 (ref 1.005–1.030)
WBC, UA: >50 WBC/hpf (ref 0–5)
pH: 5 (ref 5.0–8.0)

## 2023-09-25 LAB — BLOOD GAS, ARTERIAL
Acid-base deficit: 4.8 mmol/L — ABNORMAL HIGH (ref 0.0–2.0)
Bicarbonate: 23.5 mmol/L (ref 20.0–28.0)
FIO2: 100 %
MECHVT: 500 mL
Mechanical Rate: 16
O2 Saturation: 100 %
PEEP: 8 cmH2O
Patient temperature: 37
pCO2 arterial: 56 mmHg — ABNORMAL HIGH (ref 32–48)
pH, Arterial: 7.23 — ABNORMAL LOW (ref 7.35–7.45)
pO2, Arterial: 150 mmHg — ABNORMAL HIGH (ref 83–108)

## 2023-09-25 LAB — CBC
HCT: 48.3 % (ref 39.0–52.0)
Hemoglobin: 16.1 g/dL (ref 13.0–17.0)
MCH: 32.8 pg (ref 26.0–34.0)
MCHC: 33.3 g/dL (ref 30.0–36.0)
MCV: 98.4 fL (ref 80.0–100.0)
Platelets: 143 K/uL — ABNORMAL LOW (ref 150–400)
RBC: 4.91 MIL/uL (ref 4.22–5.81)
RDW: 14.5 % (ref 11.5–15.5)
WBC: 6.9 K/uL (ref 4.0–10.5)
nRBC: 0 % (ref 0.0–0.2)

## 2023-09-25 LAB — TROPONIN I (HIGH SENSITIVITY)
Troponin I (High Sensitivity): 60 ng/L — ABNORMAL HIGH (ref <18)
Troponin I (High Sensitivity): 58 ng/L — ABNORMAL HIGH (ref <18)

## 2023-09-25 LAB — COOXEMETRY PANEL
Carboxyhemoglobin: 1.3 % (ref 0.5–1.5)
Methemoglobin: 0.7 % (ref 0.0–1.5)
O2 Saturation: 72.4 %
Total hemoglobin: 16.6 g/dL — ABNORMAL HIGH (ref 12.0–16.0)
Total oxygen content: 70.9 %

## 2023-09-25 LAB — GLUCOSE, CAPILLARY
Glucose-Capillary: 116 mg/dL — ABNORMAL HIGH (ref 70–99)
Glucose-Capillary: 81 mg/dL (ref 70–99)

## 2023-09-25 LAB — PROCALCITONIN: Procalcitonin: >150 ng/mL

## 2023-09-25 LAB — STREP PNEUMONIAE URINARY ANTIGEN: Strep Pneumo Urinary Antigen: NEGATIVE

## 2023-09-25 LAB — MRSA NEXT GEN BY PCR, NASAL: MRSA by PCR Next Gen: NOT DETECTED

## 2023-09-25 LAB — LACTIC ACID, PLASMA
Lactic Acid, Venous: 3.1 mmol/L (ref 0.5–1.9)
Lactic Acid, Venous: 2.6 mmol/L (ref 0.5–1.9)

## 2023-09-25 LAB — BRAIN NATRIURETIC PEPTIDE: B Natriuretic Peptide: 913.5 pg/mL — ABNORMAL HIGH (ref 0.0–100.0)

## 2023-09-25 MED ORDER — SODIUM CHLORIDE 0.9 % IV SOLN: INTRAVENOUS | Status: DC

## 2023-09-25 MED ORDER — ETOMIDATE 2 MG/ML IV SOLN: 20.0000 mg | Freq: Once | INTRAVENOUS | Status: AC

## 2023-09-25 MED ORDER — GLYCOPYRROLATE 1 MG PO TABS: 1.0000 mg | ORAL_TABLET | ORAL | Status: DC | PRN

## 2023-09-25 MED ORDER — NOREPINEPHRINE 4 MG/250ML-% IV SOLN
INTRAVENOUS | Status: AC
  Administered 2023-09-25: 4 mg
  Filled 2023-09-25: qty 250

## 2023-09-25 MED ORDER — POLYVINYL ALCOHOL 1.4 % OP SOLN: 1.0000 [drp] | Freq: Four times a day (QID) | OPHTHALMIC | Status: DC | PRN

## 2023-09-25 MED ORDER — FENTANYL CITRATE (PF) 100 MCG/2ML IJ SOLN
INTRAMUSCULAR | Status: AC
  Administered 2023-09-25: 100 ug
  Filled 2023-09-25: qty 2

## 2023-09-25 MED ORDER — ENOXAPARIN SODIUM 30 MG/0.3ML IJ SOSY: 30.0000 mg | PREFILLED_SYRINGE | INTRAMUSCULAR | Status: DC

## 2023-09-25 MED ORDER — NOREPINEPHRINE 16 MG/250ML-% IV SOLN
0.0000 ug/min | INTRAVENOUS | Status: DC
  Administered 2023-09-25: 10 ug/min via INTRAVENOUS
  Filled 2023-09-25 (×2): qty 250

## 2023-09-25 MED ORDER — GLYCOPYRROLATE 0.2 MG/ML IJ SOLN: 0.2000 mg | INTRAMUSCULAR | Status: DC | PRN

## 2023-09-25 MED ORDER — FENTANYL 2500MCG IN NS 250ML (10MCG/ML) PREMIX INFUSION
0.0000 ug/h | INTRAVENOUS | Status: DC
  Administered 2023-09-25: 75 ug/h via INTRAVENOUS

## 2023-09-25 MED ORDER — ROCURONIUM BROMIDE 10 MG/ML (PF) SYRINGE
PREFILLED_SYRINGE | INTRAVENOUS | Status: AC
  Administered 2023-09-25: 80 mg via INTRAVENOUS
  Filled 2023-09-25: qty 10

## 2023-09-25 MED ORDER — FENTANYL CITRATE PF 50 MCG/ML IJ SOSY
100.0000 ug | PREFILLED_SYRINGE | Freq: Once | INTRAMUSCULAR | Status: DC
  Filled 2023-09-25: qty 2

## 2023-09-25 MED ORDER — ACETAMINOPHEN 650 MG RE SUPP: 650.0000 mg | Freq: Four times a day (QID) | RECTAL | Status: DC | PRN

## 2023-09-25 MED ORDER — CHLORHEXIDINE GLUCONATE CLOTH 2 % EX PADS
6.0000 | MEDICATED_PAD | Freq: Every day | CUTANEOUS | Status: DC
  Administered 2023-09-25: 6 via TOPICAL

## 2023-09-25 MED ORDER — VASOPRESSIN 20 UNITS/100 ML INFUSION FOR SHOCK
0.0000 [IU]/min | INTRAVENOUS | Status: DC
  Administered 2023-09-25: 0.03 [IU]/min via INTRAVENOUS
  Filled 2023-09-25 (×2): qty 100

## 2023-09-25 MED ORDER — ROCURONIUM BROMIDE 10 MG/ML (PF) SYRINGE
PREFILLED_SYRINGE | INTRAVENOUS | Status: AC
  Filled 2023-09-25: qty 10

## 2023-09-25 MED ORDER — PIPERACILLIN-TAZOBACTAM IN DEX 2-0.25 GM/50ML IV SOLN
2.2500 g | Freq: Three times a day (TID) | INTRAVENOUS | Status: DC
  Administered 2023-09-25: 2.25 g via INTRAVENOUS
  Filled 2023-09-25 (×2): qty 50

## 2023-09-25 MED ORDER — MIDAZOLAM HCL 2 MG/2ML IJ SOLN
2.0000 mg | Freq: Once | INTRAMUSCULAR | Status: DC
  Filled 2023-09-25: qty 2

## 2023-09-25 MED ORDER — LACTATED RINGERS IV BOLUS: 500.0000 mL | Freq: Once | INTRAVENOUS | Status: DC

## 2023-09-25 MED ORDER — ETOMIDATE 2 MG/ML IV SOLN
INTRAVENOUS | Status: AC
  Administered 2023-09-25: 10 mg via INTRAVENOUS
  Filled 2023-09-25: qty 10

## 2023-09-25 MED ORDER — FENTANYL 2500MCG IN NS 250ML (10MCG/ML) PREMIX INFUSION
0.0000 ug/h | INTRAVENOUS | Status: DC
  Administered 2023-09-25: 25 ug/h via INTRAVENOUS
  Filled 2023-09-25: qty 250

## 2023-09-25 MED ORDER — FENTANYL BOLUS VIA INFUSION
100.0000 ug | INTRAVENOUS | Status: DC | PRN
  Administered 2023-09-25 (×2): 100 ug via INTRAVENOUS

## 2023-09-25 MED ORDER — ROCURONIUM BROMIDE 10 MG/ML (PF) SYRINGE: 80.0000 mg | PREFILLED_SYRINGE | Freq: Once | INTRAVENOUS | Status: AC

## 2023-09-25 MED ORDER — ACETAMINOPHEN 325 MG PO TABS: 650.0000 mg | ORAL_TABLET | Freq: Four times a day (QID) | ORAL | Status: DC | PRN

## 2023-09-25 MED ORDER — PANTOPRAZOLE SODIUM 40 MG IV SOLR: 40.0000 mg | Freq: Every day | INTRAVENOUS | Status: DC

## 2023-09-26 LAB — URINE CULTURE: Culture: 40000 — AB

## 2023-09-27 LAB — CULTURE, BLOOD (ROUTINE X 2)
Culture: NO GROWTH
Culture: NO GROWTH
Special Requests: ADEQUATE
Special Requests: ADEQUATE

## 2023-09-27 LAB — CULTURE, RESPIRATORY W GRAM STAIN

## 2023-09-28 LAB — LEGIONELLA PNEUMOPHILA SEROGP 1 UR AG: L. pneumophila Serogp 1 Ur Ag: NEGATIVE

## 2023-10-04 NOTE — Procedures (Signed)
 ARTERIAL CATHETER INSERTION PROCEDURE NOTE  Dustin Townsend  [id number]  [birth year]  Date:2023/10/05  Time:3:23 AM   Provider Performing: Almarie DELENA Nose   Procedure: Insertion of Arterial Line (63379) with US  guidance (23062)   Indication(s) Blood pressure monitoring and/or need for frequent ABGs  Consent Unable to obtain consent due to emergent nature of procedure.  Anesthesia None  Time Out Verified patient identification, verified procedure, site/side was marked, verified correct patient position, special equipment/implants available, medications/allergies/relevant history reviewed, required imaging and test results available.  Sterile Technique Maximal sterile technique including full sterile barrier drape, hand hygiene, sterile gown, sterile gloves, mask, hair covering, sterile ultrasound probe cover (if used).  Procedure Description Area of catheter insertion was cleaned with chlorhexidine  and draped in sterile fashion. With real-time ultrasound guidance an arterial catheter was placed into the right radial artery.  Appropriate arterial tracings confirmed on monitor.    Complications/Tolerance None; patient tolerated the procedure well.  EBL Minimal  Specimen(s) None   [name], DNP, CCRN, FNP-C, AGACNP-BC Acute Care & Family Nurse Practitioner  [facility]  See Amion for personal pager PCCM on call pager [phone] until 7 am

## 2023-10-04 NOTE — Progress Notes (Signed)
 [facility]IN[facility] CARDIOLOGY PROGRESS NOTE   Patient ID: Dustin Townsend MRN: [id number] DOB/AGE: 1933/09/20 89 y.o.  Admit date: 09/22/2023 Referring Physician Dr. Amaryllis Dare Primary Physician Steva Clotilda DEL, NP Primary Cardiologist none Reason for Consultation Afib RVR & AECHF   HPI: Dustin Townsend is a 89 y.o. male  with a past medical history of HTN, HLD, Afib not on OAC and previous CVA who presented to the ED on 09/22/2023 for dyspnea. Cardiology was placed on consultation for Afib RVR and AECHF. Patient is currently being treated for possible pneumonia and kidney stones.   Interval History:  -Patient intubated and sedated. HR elevated in the 110-120s on tele.  -BP controlled on levo and vaso.  -CT chest/abd/pelv this AM with multifocal pneumonia, free air over ant abd concerning for bowel perforation. Gen surg consulted.  Review of systems complete and found to be negative unless listed above    Vitals:   Sep 28, 2023 0400 September 28, 2023 0403 09/28/23 0500 Sep 28, 2023 0600  BP: 95/73  122/70 115/76  Pulse: 97 87 96 82  Resp: 16 18 (!) 25 (!) 30  Temp:      TempSrc:      SpO2: 94% 94% 95% 92%  Weight:  83.9 kg    Height:         Intake/Output Summary (Last 24 hours) at 2023/09/28 0755 Last data filed at Sep 28, 2023 0615 Gross per 24 hour  Intake 1888.96 ml  Output 300 ml  Net 1588.96 ml     PHYSICAL EXAM General: Ill appearing male, intubated and sedated. HEENT: Normocephalic and atraumatic. Neck: No JVD.  Lungs: Mechanical breath sounds.  Heart: HRRR. Normal S1 and S2 without gallops or murmurs.  Abdomen: Non-distended appearing.  Msk: Normal strength and tone for age. Extremities: Warm and well perfused. No clubbing, cyanosis. Trace edema.    LABS: Basic Metabolic Panel: Recent Labs    09/24/23 1531 2023-09-28 0132  NA 133* 132*  K 4.7 4.8  CL 97* 92*  CO2 22 24  GLUCOSE 110* 115*  BUN 60* 70*  CREATININE 2.86* 3.65*  CALCIUM 9.0 9.0   Liver Function  Tests: Recent Labs    09/22/23 1935  AST 21  ALT 16  ALKPHOS 83  BILITOT 1.8*  PROT 6.8  ALBUMIN 3.7   Recent Labs    09/22/23 1935  LIPASE 23   CBC: Recent Labs    09/22/23 1935 09/23/23 0414 09/28/23 0132  WBC 5.8 2.1* 6.9  NEUTROABS 4.6  --   --   HGB 14.8 15.2 16.1  HCT 43.5 45.8 48.3  MCV 98.0 98.3 98.4  PLT 186 168 143*   Cardiac Enzymes: Recent Labs    09/22/23 1935 09/22/23 2219 2023/09/28 0514  TROPONINIHS 12 16 60*   BNP: Recent Labs    09/22/23 1935 09/28/2023 0132  BNP 231.7* 913.5*   D-Dimer: No results for input(s): DDIMER in the last 72 hours. Hemoglobin A1C: No results for input(s): HGBA1C in the last 72 hours. Fasting Lipid Panel: No results for input(s): CHOL, HDL, LDLCALC, TRIG, CHOLHDL, LDLDIRECT in the last 72 hours. Thyroid Function Tests: No results for input(s): TSH, T4TOTAL, T3FREE, THYROIDAB in the last 72 hours.  Invalid input(s): FREET3 Anemia Panel: No results for input(s): VITAMINB12, FOLATE, FERRITIN, TIBC, IRON, RETICCTPCT in the last 72 hours.  DG Chest Port 1 View Result Date: 28-Sep-2023 EXAM: 1 VIEW XRAY OF THE CHEST 2023/09/28 03:05:00 AM COMPARISON: 09/28/2023 CLINICAL HISTORY: Central line placement verification FINDINGS: LINES, TUBES AND DEVICES:  Endotracheal tube terminates 2 cm above the carina. Right IJ venous catheter terminates at the cavotrial junction. Defibrillator pads overlying the left hemithorax. LUNGS AND PLEURA: Multifocal patchy opacities, left mid lung predominant, mildly progressive and favoring multifocal pneumonia over interstitial edema. Small bilateral pleural effusions. HEART AND MEDIASTINUM: Cardiomegaly. BONES AND SOFT TISSUES: Thoracic aortic atherosclerosis. IMPRESSION: 1. Suspected multifocal pneumonia, less likely asymmetric edema, mildly progressive. 2. Cardiomegaly with small bilateral pleural effusions. 3. Endotracheal tube terminates 2 cm above the carina.  Additional support apparatus as above. Electronically signed by: Dustin Pebbles MD 2023-09-26 03:16 AM EDT RP Workstation: [id number]   DG Chest Port 1 View Result Date: 26-Sep-2023 CLINICAL DATA:  Intubation EXAM: PORTABLE CHEST 1 VIEW COMPARISON:  09/24/2023 FINDINGS: Endotracheal tube is 4 cm above the carina. NG tube is in the stomach. Cardiomegaly, vascular congestion. Layering bilateral effusions and bibasilar airspace opacities. No pneumothorax. IMPRESSION: Support devices in expected position as above. Cardiomegaly, vascular congestion. Layering bilateral effusions with bibasilar infiltrates. Electronically Signed   By: Dustin Townsend M.D.   On: September 26, 2023 01:56   CT HEAD WO CONTRAST ( ) Result Date: 2023-09-26 EXAM: CT HEAD WITHOUT CONTRAST 09-26-2023 12:21:15 AM TECHNIQUE: CT of the head was performed without the administration of intravenous contrast. Automated exposure control, iterative reconstruction, and/or weight based adjustment of the mA/kV was utilized to reduce the radiation dose to as low as reasonably achievable. COMPARISON: None available. CLINICAL HISTORY: Mental status change, unknown cause. FINDINGS: BRAIN AND VENTRICLES: No acute hemorrhage. No evidence of acute infarct. No hydrocephalus. No extra-axial collection. No mass effect or midline shift. Chronic ischemic white matter changes. 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 ORBITS: No acute abnormality. SINUSES: No acute abnormality. SOFT TISSUES AND SKULL: No acute soft tissue abnormality. No skull fracture. IMPRESSION: 1. No acute intracranial abnormality. 2. Chronic ischemic white matter changes, old left frontal infarct, and mild volume loss. Electronically signed by: Dustin Stanford MD Sep 26, 2023 12:45 AM EDT RP Workstation: [id number]   DG Chest 1 View Result Date: 09/24/2023 CLINICAL DATA:  Dyspnea EXAM: CHEST  1 VIEW COMPARISON:  Chest x-ray 09/22/2023 FINDINGS: Cardiac silhouette is markedly enlarged, unchanged.  There are atherosclerotic calcifications of the aorta. There small bilateral pleural effusions and bibasilar infiltrates which have increased from prior. There is no pneumothorax or acute fracture. IMPRESSION: 1. Increasing small bilateral pleural effusions and bibasilar infiltrates. 2. Stable marked cardiomegaly. Electronically Signed   By: Dustin Townsend M.D.   On: 09/24/2023 23:38   US  RENAL Result Date: 09/24/2023 EXAM: US  Retroperitoneum Complete, Renal. CLINICAL HISTORY: [id number] AKI (acute kidney injury) (HCC) 309830. AKI (acute kidney injury) (HCC) TECHNIQUE: Real-time ultrasound of the retroperitoneum (complete) with image documentation. COMPARISON: CT abdomen/pelvis dated 09/22/2023. FINDINGS: RIGHT KIDNEY: Measures 10.1 x 3.9 x 5.4 cm (110 ml). 3.3 x 3.3 x 3.1 cm simple right upper pole cyst. 2.4 x 2.2 x 1.8 cm simple right upper pole renal cyst. LEFT KIDNEY: Measures 10.7 x 5.6 x 5.9 cm (188 ml) but is poorly visualized. The known left renal cysts on CT are not well evaluated on ultrasound. BLADDER: Unremarkable as visualized. VASCULATURE: Right pleural effusion. OTHER FINDINGS: Ascites. IMPRESSION: 1. No hydronephrosis. 2. Simple right renal cysts, benign. 3. Poor visualization of the left kidney limits evaluation of known left renal cysts. 4. Right pleural effusion and ascites. Electronically signed by: Dustin Pebbles MD 09/24/2023 08:45 PM EDT RP Workstation: [id number]   DG Abd 1 View Result Date: 09/24/2023 CLINICAL DATA:  13914 Abdominal distention 13914 EXAM: ABDOMEN -  1 VIEW COMPARISON:  None Available. FINDINGS: The bowel gas pattern is non-obstructive. No evidence of pneumoperitoneum, within the limitations of a supine film. No acute osseous abnormalities. The soft tissues are within normal limits. Surgical changes, devices, tubes and lines: Aorto bi-iliac vascular stent noted. There is left hip arthroplasty with small-to-moderate associated heterotopic ossification. IMPRESSION:  *Nonobstructive bowel gas pattern. Electronically Signed   By: Dustin Townsend M.D.   On: 09/24/2023 16:29    ECHO pending  TELEMETRY reviewed by me September 26, 2023: atrial fibrillation rate 110-120s   ASSESSMENT AND PLAN: Dustin Townsend is a 89 y.o. male  with a past medical history of HTN, HLD, Afib not on [facility] and previous CVA who presented to the ED on 09/22/2023 for dyspnea. Cardiology was placed on consultation for Afib RVR and AECHF. Patient is currently being treated for possible pneumonia and kidney stones.    # Atrial fibrillation RVR # Hx atrial fibrillation, medication noncompliance # Acute on chronic HFpEF, currently decompensated # R UPJ obstruction 2/2 stone, UTI # AKI # Possible bowel perforation # Hypotension Suspect heart failure exacerbation due to possible PNA and Afib RVR. Plan for rate control strategy for AF as he is not a good candidate for OAC and is currently not anticoagulated. Cr up to 3.65 today, IV diuresis discontinued yesterday. CT chest/abd/pelv this AM with multifocal pneumonia, free air over ant abd concerning for bowel perforation. Currently on vaso and levo.  -Start on po cardizem  yesterday but now stopped given hypotension. S/p 1 dose of digoxin  yesterday, would not give further doses due to rising Cr.  -Discussed with PCCM team that at this time only option would be IV amiodarone. Starting this would carry low risk of CVA if he were to convert. -Currently not on heparin gtt as patient is high fall risk.He is not on OAC at home, he only takes baby aspirin . -Maintain on telemetry. -Optimize lytes, K>4 and Mag>2.  ADDENDUM: Family decided to transition patient to comfort measures. Nothing further from cardiac perspective so will sign off. Please haiku with any questions.   This patient's plan of care was discussed and created with Dr. Wilburn and he is in agreement.    Signed: Danita Bloch, PA-C September 26, 2023, 7:55 AM [facility] Cardiology

## 2023-10-04 NOTE — Consult Note (Addendum)
 NAME:  Dustin Townsend, MRN:  [id number], DOB:  09-04-1933, LOS: 3 ADMISSION DATE:  09/22/2023, CONSULTATION DATE:  Oct 12, 2023 REFERRING MD: Madison Mansy REASON FOR CONSULT: Acute respiratory distress  HPI  89 y.o male with significant PMH of CHF, HTN, HLD, A-fib not on anticoagulation, SDH, GAD, colon cancer, diverticulitis, tobacco use, endovascular stent graft for AAA, LUTS, B12 deficiency, left hip fracture status post hemiarthroplasty who presented to the ED with chief complaints of cute onset of progressive dyspnea with associated dry cough, orthopnea and abdominal pain.   ED Course: Initial vital signs showed HR o 66 f beats/minute, BP 178/105 mm Hg, the RR 13 breaths/minute, and the oxygen saturation 96% on RA and a temperature of 97.27F (36.5C).   Pertinent Labs/Diagnostics Findings: Na+/ K+: 138/3.4.  Glucose: 161  CBC unremarkable Lactic acid: 2.0 COVID PCR: Negative,  BNP: 231.7  CXR> left lower lobe opacity suspicious for pneumonia with possible small left pleural effusion CTA Chest/Abd/pelvis> no evidence of PE, small bilateral pleural effusion, ascites and a large right inguinal hernia containing several fluid-filled but nondilated small bowel loops, no evidence of bowel obstruction. Nonobstructing stone just proximal to the right UPJ  Patient was started on Cardizem  infusion for A-fib with RVR and cardiology consulted.  Urology was also consulted for nonobstructing renal stones.  Patient started on ceftriaxone  for possible UTI and admitted to [facility] service  SEE SIGNIFICANT EVENT BELOW  Past Medical History  HTN, HLD, A-fib not on anticoagulation, SDH, GAD, colon cancer, diverticulitis, tobacco use, endovascular stent graft for AAA, LUTS, B12 deficiency, left hip fracture status post hemiarthroplasty   Significant Hospital Events   9/19: Admitted to [facility] service with A-fib with RVR, new onset acute diastolic CHF, and UTI 9/20: Cardiology consulted for A-fib and CHF and urology  consulted for ongoing right sided abdominal pain 9/21: Patient with worsening abdominal pain and distention, KUB with no abnormality.  Overnight patient noted to be hypoxic with slight increased work of breathing. October 12, 2023: Patient transferred to the ICU for respiratory distress, PCCM consulted and patient intubated emergently for airway protection.  Large copious coffee ground emesis noted prior to intubation with approximately 1L noted in canister.  Consults:  PCCM Urology Cardiology Procedures:  Oct 12, 2023: Endotracheal intubation 10-12-23: Right IJ central line 2023/10/12: Right radial art line  Interim History / Subjective:      Micro Data:  9/19: SARS-CoV-2 PCR> negative 9/19: Influenza PCR> negative 9/19: Blood culture x2> 9/20: Urine Culture> 10/12/23: MRSA PCR>>  October 12, 2023: Strep pneumo urinary antigen> 10-12-2023: Legionella urinary antigen>  Antimicrobials:  Zosyn  10/12/23>>  OBJECTIVE  Blood pressure (!) 87/67, pulse 90, temperature 99.2 F (37.3 C), temperature source Axillary, resp. rate (!) 21, height 5' 6 (1.676 m), weight 83.9 kg, SpO2 95%.    Vent Mode: PRVC FiO2 (%):  [60 %-100 %] 100 % Set Rate:  [18 bmp] 18 bmp Vt Set:  [500 mL] 500 mL PEEP:  [5 cmH20-8 cmH20] 8 cmH20 Plateau Pressure:  [22 cmH20] 22 cmH20   Intake/Output Summary (Last 24 hours) at 10-12-2023 0405 Last data filed at 09/24/2023 2000 Gross per 24 hour  Intake 965.59 ml  Output 250 ml  Net 715.59 ml   Filed Weights   09/22/23 1922 09/23/23 2303 12-Oct-2023 0403  Weight: 86.2 kg 83.9 kg 83.9 kg   Physical Examination  GEN: Critically ill patient, intubated and sedated HEENT: [place]/AT. PERRL, sclerae anicteric. HEART: Irregular rhythm, Afib rvr, S1, S2, no M/R/G,  LUNGS: CTAB, mild crackles without wheezes,  no increased WOB,  EXTREMITIES: Trace bilateral lower Edema, cap refill >3sec NEURO: No gross focal deficits. PSYCH: UTA ABDOMINAL: Distended: BS x 4, NTND SKIN: Intact, warm, no rashes lesion, or  ulcer  Labs/imaging that I personally reviewed  (right click and Reselect all SmartList Selections daily)    DG Chest Port 1 View Result Date: 10/06/2023 EXAM: 1 VIEW XRAY OF THE CHEST 06-Oct-2023 03:05:00 AM COMPARISON: 10-06-2023 CLINICAL HISTORY: Central line placement verification FINDINGS: LINES, TUBES AND DEVICES: Endotracheal tube terminates 2 cm above the carina. Right IJ venous catheter terminates at the cavotrial junction. Defibrillator pads overlying the left hemithorax. LUNGS AND PLEURA: Multifocal patchy opacities, left mid lung predominant, mildly progressive and favoring multifocal pneumonia over interstitial edema. Small bilateral pleural effusions. HEART AND MEDIASTINUM: Cardiomegaly. BONES AND SOFT TISSUES: Thoracic aortic atherosclerosis. IMPRESSION: 1. Suspected multifocal pneumonia, less likely asymmetric edema, mildly progressive. 2. Cardiomegaly with small bilateral pleural effusions. 3. Endotracheal tube terminates 2 cm above the carina. Additional support apparatus as above. Electronically signed by: Pinkie Pebbles MD 06-Oct-2023 03:16 AM EDT RP Workstation: [id number]   DG Chest Port 1 View Result Date: October 06, 2023 CLINICAL DATA:  Intubation EXAM: PORTABLE CHEST 1 VIEW COMPARISON:  09/24/2023 FINDINGS: Endotracheal tube is 4 cm above the carina. NG tube is in the stomach. Cardiomegaly, vascular congestion. Layering bilateral effusions and bibasilar airspace opacities. No pneumothorax. IMPRESSION: Support devices in expected position as above. Cardiomegaly, vascular congestion. Layering bilateral effusions with bibasilar infiltrates. Electronically Signed   By: Franky Crease M.D.   On: Oct 06, 2023 01:56   CT HEAD WO CONTRAST ( ) Result Date: 2023/10/06 EXAM: CT HEAD WITHOUT CONTRAST Oct 06, 2023 12:21:15 AM TECHNIQUE: CT of the head was performed without the administration of intravenous contrast. Automated exposure control, iterative reconstruction, and/or weight based  adjustment of the mA/kV was utilized to reduce the radiation dose to as low as reasonably achievable. COMPARISON: None available. CLINICAL HISTORY: Mental status change, unknown cause. FINDINGS: BRAIN AND VENTRICLES: No acute hemorrhage. No evidence of acute infarct. No hydrocephalus. No extra-axial collection. No mass effect or midline shift. Chronic ischemic white matter changes. 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 ORBITS: No acute abnormality. SINUSES: No acute abnormality. SOFT TISSUES AND SKULL: No acute soft tissue abnormality. No skull fracture. IMPRESSION: 1. No acute intracranial abnormality. 2. Chronic ischemic white matter changes, old left frontal infarct, and mild volume loss. Electronically signed by: Franky Stanford MD 2023-10-06 12:45 AM EDT RP Workstation: [id number]   DG Chest 1 View Result Date: 09/24/2023 CLINICAL DATA:  Dyspnea EXAM: CHEST  1 VIEW COMPARISON:  Chest x-ray 09/22/2023 FINDINGS: Cardiac silhouette is markedly enlarged, unchanged. There are atherosclerotic calcifications of the aorta. There small bilateral pleural effusions and bibasilar infiltrates which have increased from prior. There is no pneumothorax or acute fracture. IMPRESSION: 1. Increasing small bilateral pleural effusions and bibasilar infiltrates. 2. Stable marked cardiomegaly. Electronically Signed   By: Greig Pique M.D.   On: 09/24/2023 23:38   US  RENAL Result Date: 09/24/2023 EXAM: US  Retroperitoneum Complete, Renal. CLINICAL HISTORY: 409830 AKI (acute kidney injury) (HCC) 309830. AKI (acute kidney injury) (HCC) TECHNIQUE: Real-time ultrasound of the retroperitoneum (complete) with image documentation. COMPARISON: CT abdomen/pelvis dated 09/22/2023. FINDINGS: RIGHT KIDNEY: Measures 10.1 x 3.9 x 5.4 cm (110 ml). 3.3 x 3.3 x 3.1 cm simple right upper pole cyst. 2.4 x 2.2 x 1.8 cm simple right upper pole renal cyst. LEFT KIDNEY: Measures 10.7 x 5.6 x 5.9 cm (188 ml) but is poorly visualized. The known  left renal cysts on CT are not well evaluated on ultrasound. BLADDER: Unremarkable as visualized. VASCULATURE: Right pleural effusion. OTHER FINDINGS: Ascites. IMPRESSION: 1. No hydronephrosis. 2. Simple right renal cysts, benign. 3. Poor visualization of the left kidney limits evaluation of known left renal cysts. 4. Right pleural effusion and ascites. Electronically signed by: Pinkie Pebbles MD 09/24/2023 08:45 PM EDT RP Workstation: [id number]   DG Abd 1 View Result Date: 09/24/2023 CLINICAL DATA:  13914 Abdominal distention 13914 EXAM: ABDOMEN - 1 VIEW COMPARISON:  None Available. FINDINGS: The bowel gas pattern is non-obstructive. No evidence of pneumoperitoneum, within the limitations of a supine film. No acute osseous abnormalities. The soft tissues are within normal limits. Surgical changes, devices, tubes and lines: Aorto bi-iliac vascular stent noted. There is left hip arthroplasty with small-to-moderate associated heterotopic ossification. IMPRESSION: *Nonobstructive bowel gas pattern. Electronically Signed   By: Ree Molt M.D.   On: 09/24/2023 16:29    Labs   CBC: Recent Labs  Lab 09/22/23 1935 09/23/23 0414 10-03-2023 0132  WBC 5.8 2.1* 6.9  NEUTROABS 4.6  --   --   HGB 14.8 15.2 16.1  HCT 43.5 45.8 48.3  MCV 98.0 98.3 98.4  PLT 186 168 143*    Basic Metabolic Panel: Recent Labs  Lab 09/22/23 1935 09/23/23 0414 09/24/23 1531 October 03, 2023 0132  NA 138 139 133* 132*  K 3.4* 3.8 4.7 4.8  CL 103 103 97* 92*  CO2 22 24 22 24   GLUCOSE 161* 137* 110* 115*  BUN 20 23 60* 70*  CREATININE 0.96 1.01 2.86* 3.65*  CALCIUM 9.1 8.7* 9.0 9.0   GFR: Estimated Creatinine Clearance: 14.2 mL/min (A) (by C-G formula based on SCr of 3.65 mg/dL (H)). Recent Labs  Lab 09/22/23 1935 09/22/23 2219 09/23/23 0414 Oct 03, 2023 0132 Oct 03, 2023 0235  WBC 5.8  --  2.1* 6.9  --   LATICACIDVEN 2.0* 2.1*  --   --  3.1*    Liver Function Tests: Recent Labs  Lab 09/22/23 1935  AST 21  ALT 16   ALKPHOS 83  BILITOT 1.8*  PROT 6.8  ALBUMIN 3.7   Recent Labs  Lab 09/22/23 1935  LIPASE 23   No results for input(s): AMMONIA in the last 168 hours.  ABG    Component Value Date/Time   PHART 7.41 09/24/2023 2300   PCO2ART 37 09/24/2023 2300   PO2ART 65 (L) 09/24/2023 2300   HCO3 23.5 09/24/2023 2300   ACIDBASEDEF 0.8 09/24/2023 2300   O2SAT 94.9 09/24/2023 2300     Coagulation Profile: No results for input(s): INR, PROTIME in the last 168 hours.  Cardiac Enzymes: No results for input(s): CKTOTAL, CKMB, CKMBINDEX, TROPONINI in the last 168 hours.  HbA1C: No results found for: HGBA1C  CBG: No results for input(s): GLUCAP in the last 168 hours.  Review of Systems:   Unable to be obtained secondary to the patient's intubated and sedated status.   Past Medical History  He,  has a past medical history of AAA (abdominal aortic aneurysm) (HCC), CVA (cerebral vascular accident) (HCC), Diverticulitis, GERD (gastroesophageal reflux disease), Hyperlipemia, and Hypertension.   Surgical History    Past Surgical History:  Procedure Laterality Date   ABDOMINAL AORTIC ANEURYSM REPAIR     colectomy     HIP ARTHROPLASTY Left 01/16/2018   Procedure: ARTHROPLASTY BIPOLAR HIP (HEMIARTHROPLASTY);  Surgeon: Tobie Priest, MD;  Location: [facility] ORS;  Service: Orthopedics;  Laterality: Left;     Social History   reports that he has been  smoking. He has never used smokeless tobacco.   Family History   His family history includes Hypertension in his mother.   Allergies No Known Allergies   Home Medications  Prior to Admission medications   Medication Sig Start Date End Date Taking? Authorizing Provider  acetaminophen  (TYLENOL ) 650 MG CR tablet Take 650 mg by mouth every 8 (eight) hours as needed. 11/04/22  Yes [provider]  senna-docusate (SENOKOT-S) 8.6-50 MG tablet Take 1 tablet by mouth 2 (two) times daily. 01/19/18  Yes Sherial Bail, MD   tamsulosin  (FLOMAX ) 0.4 MG CAPS capsule Take 1 capsule (0.4 mg total) by mouth daily. 03/20/18  Yes McGowan, Clotilda A, PA-C  clotrimazole-betamethasone (LOTRISONE) lotion Apply topically. Patient not taking: Reported on 09/22/2023    [provider]  enoxaparin  (LOVENOX ) 40 MG/0.4ML injection Inject 0.4 mLs (40 mg total) into the skin daily for 14 days. 01/19/18 02/02/18  Sherial Bail, MD  furosemide  (LASIX ) 40 MG tablet Take by mouth. 02/12/18 02/12/19  [provider]  traMADol  (ULTRAM ) 50 MG tablet Take 1 tablet (50 mg total) by mouth every 6 (six) hours as needed for moderate pain. Patient not taking: Reported on 09/22/2023 01/18/18   Verlinda Boas, PA-C  Scheduled Meds:  Chlorhexidine  Gluconate Cloth  6 each Topical Daily   diltiazem   120 mg Oral Daily   enoxaparin  (LOVENOX ) injection  40 mg Subcutaneous Q24H   fentaNYL  (SUBLIMAZE ) injection  100 mcg Intravenous Once   furosemide   20 mg Intravenous Once   ipratropium-albuterol   3 mL Nebulization QID   tamsulosin   0.4 mg Oral Daily   Continuous Infusions:  lactated ringers  100 mL/hr at 09/24/23 1807   norepinephrine  (LEVOPHED ) Adult infusion 15 mcg/min (October 02, 2023 0329)   piperacillin -tazobactam (ZOSYN )  IV     vasopressin  0.03 Units/min (2023-10-02 0428)   PRN Meds:.acetaminophen  **OR** acetaminophen , alum & mag hydroxide-simeth, fentaNYL  (SUBLIMAZE ) injection, HYDROcodone -acetaminophen , magnesium  hydroxide, ondansetron  **OR** ondansetron  (ZOFRAN ) IV, senna-docusate, traZODone   Active Hospital Problem list   See systems below  Assessment & Plan:  #Acute Hypoxic Respiratory Failure #Aspiration pneumonia #Decompensated CHF -con't full mechanical support 6-8cc/kg/Vt  -titrate FiO2, PEEP to maintain O2 sat >90%  -Lung protective ventilation  -PRN Chest X-ray & ABG -PRN and scheduled bronchodilators -SAT/SBT when appropriate  -prn fentanyl , prop for RASS -1   #Acute on Chronic HFpEF #Afib RVR #HTN/HLD ? Volume  Overload, -BNP: up 913.5 from 231.7 -Chest XR 02-Oct-2023):Suspected multifocal pneumonia, less likely asymmetric edema, mildly progressive. Cardiomegaly with small bilateral pleural effusions. -ECHO Pending -Hold Lasix  iso for renal function and hypotension -Hold Cardizem  in the setting of hypotension requiring multiple pressors -Consider amiodarone for rate control if no improvement -Per cardiology not a candidate for anticoagulation hx of SDH, recurrent hematuria -Keep K>4,Mag>2 -Cardiology following  #Sepsis secondary to: #Multifocal Pneumonia #Obstructive Uropathy #Possible intra-abdominal source concerns for ileus versus a bowel obstruction CT chest abdomen pelvis pending -F/u cultures, trend lactic/ PCT -Monitor WBC/ fever curve -Obtain MRSA nasal swab, RVP, legionella, strep urine Ag -IV antibiotics Zosyn  -Pressors PRN for MAP goal >65 -Start stress dose steroids -Urology following    #AKI  #NAGMA with Lactic Acidosis #Hyponatremia -Follow BMP+Mag -Ensure adequate renal perfusion -Avoid nephrotoxic  -Replace lytes -strict I&O -Nephrology consult may need CRRT/iHD if no improvement in renal function   #Sedation needs in setting of mechanical ventilation -CT head negative -Maintain a RASS goal of 0 to -1 -Propofol  and Fentanyl  to maintain RASS goal -Avoid sedating medications as able -Daily wake up assessment  Best practice:  Diet:  NPO Pain/Anxiety/Delirium protocol (if indicated): Yes (RASS goal -1) VAP protocol (if indicated): Yes DVT prophylaxis: LMWH GI prophylaxis: PPI Glucose control:  SSI Yes Central venous access:  Yes, and it is still needed Arterial line:  Yes, and it is still needed Foley:  Yes, and it is still needed Mobility:  bed rest  PT consulted: N/A Last date of multidisciplinary goals of care discussion []  Code Status:  full code Disposition: ICU   = Goals of Care =  Primary Emergency ContactAdonnis, [place], Home Phone:  [phone]   Critical care time: 45 minutes        [name] DNP, CCRN, FNP-C, AGACNP-BC Acute Care & Family Nurse Practitioner [facility] PCCM on call pager [phone]

## 2023-10-04 NOTE — Plan of Care (Signed)
  Problem: Clinical Measurements: Goal: Ability to maintain clinical measurements within normal limits will improve Outcome: Not Progressing Goal: Will remain free from infection Outcome: Progressing Goal: Diagnostic test results will improve Outcome: Not Progressing Goal: Respiratory complications will improve Outcome: Not Progressing Goal: Cardiovascular complication will be avoided Outcome: Not Progressing   Problem: Pain Managment: Goal: General experience of comfort will improve and/or be controlled Outcome: Progressing   Problem: Safety: Goal: Ability to remain free from injury will improve Outcome: Progressing   Problem: Skin Integrity: Goal: Risk for impaired skin integrity will decrease Outcome: Progressing

## 2023-10-04 NOTE — IPAL (Signed)
  Interdisciplinary Goals of Care Family Meeting   Date carried out: October 21, 2023  Location of the meeting: Bedside  Member's involved: Physician and Family Member or next of kin  Durable Power of Attorney or acting medical decision maker: Sons Dustin Townsend and Dustin Townsend  Discussion: We discussed goals of care for Dustin Townsend .  CT abdomen and pelvis concerning for bowel perforation. He had the same presentation in the 1980s and clearly stated to his sons that he would not want to go through this again. Given the latter, poor prognosis and worsening shock, family have elected to proceed with comfort measures only.   Code status:   Code Status: Do not attempt resuscitation (DNR) - Comfort care   Disposition: In-patient comfort care  Time spent for the meeting: 15 minutes    Darrin Barn, MD  October 21, 2023, 12:03 PM

## 2023-10-04 NOTE — Consult Note (Addendum)
 [facility]-General Surgery  SURGICAL CONSULTATION NOTE    HISTORY OF PRESENT ILLNESS (HPI):  89 y.o. male presented to [facility] ED on 09/22/2023 for generalized weakness associated with shortness of breath and abdominal pain.  In the ED, patient was hypertensive with a BP of 170/105, HR of 66, RR of 13 and temp of 97.7 F.  Patient was found to be in A-fib with RVR, saturating 96% on 3 L of oxygen. Labs did not indicate leukocytosis.  Lactic acid of 2.0.  BMP of 231.7. Chest x-ray showed finding suspicious for pneumonia and possible small left pleural effusion.  Report of CT of abdomen read mild ascites and large right inguinal hernia containing nondilated small bowel loops.  No bowel obstruction. Small amount of free fluid within that inguinal canal.  They also noted a nonobstructing 9 mm stone in the right renal pelvis, no hydronephrosis. Patient was admitted to the medicine team. Cardiology was consulted for A-fib  with RVR. Urology was also consulted which recommended outpatient follow-up for nonobstructing renal stone. Patient was then complaining of right sided abdominal pain, family requested reevaluation and urology was consulted again on 09/20.   Last night patient started experiencing hypoxia respiratory failure requiring intubation mechanical ventilation.  Patient was transferred to the ICU. Coffee ground material was noted from NG tube.  Repeat stat CT of abdomen was ordered which showed small amount of free air over the anterior abdomen with site of perforation undetermined. It also showed several new dilated small bowels consistent with SBO versus ileus. In addition to, worsening ascites. According to the family, patient has a history of bowel perforation in 1980s which required bowel resection and colostomy.   Surgery is consulted by  Dr. Malka in this context for evaluation and management of possible bowel perforation.  PAST MEDICAL HISTORY (PMH):  Past Medical History:  Diagnosis  Date   AAA (abdominal aortic aneurysm) (HCC)    CVA (cerebral vascular accident) (HCC)    Diverticulitis    GERD (gastroesophageal reflux disease)    Hyperlipemia    Hypertension      PAST SURGICAL HISTORY (PSH):  Past Surgical History:  Procedure Laterality Date   ABDOMINAL AORTIC ANEURYSM REPAIR     colectomy     HIP ARTHROPLASTY Left 01/16/2018   Procedure: ARTHROPLASTY BIPOLAR HIP (HEMIARTHROPLASTY);  Surgeon: Tobie Priest, MD;  Location: [facility] ORS;  Service: Orthopedics;  Laterality: Left;     MEDICATIONS:  Prior to Admission medications   Medication Sig Start Date End Date Taking? Authorizing Provider  acetaminophen  (TYLENOL ) 650 MG CR tablet Take 650 mg by mouth every 8 (eight) hours as needed. 11/04/22  Yes [provider]  senna-docusate (SENOKOT-S) 8.6-50 MG tablet Take 1 tablet by mouth 2 (two) times daily. 01/19/18  Yes Sherial Bail, MD  tamsulosin  (FLOMAX ) 0.4 MG CAPS capsule Take 1 capsule (0.4 mg total) by mouth daily. 03/20/18  Yes McGowan, Clotilda A, PA-C  furosemide  (LASIX ) 40 MG tablet Take 40 mg by mouth daily as needed for edema. 02/12/18 02/12/19  [provider]     ALLERGIES:  No Known Allergies   SOCIAL HISTORY:  Social History   Socioeconomic History   Marital status: Married    Spouse name: Not on file   Number of children: Not on file   Years of education: Not on file   Highest education level: Not on file  Occupational History   Not on file  Tobacco Use   Smoking status: Every Day   Smokeless  tobacco: Never  Substance and Sexual Activity   Alcohol  use: Not on file   Drug use: Not on file   Sexual activity: Not on file  Other Topics Concern   Not on file  Social History Narrative   Not on file   Social Drivers of Health   Financial Resource Strain: Low Risk  (11/04/2022)   Received from [facility]   Overall Financial Resource Strain (CARDIA)    Difficulty of Paying Living Expenses: Not very hard   Food Insecurity: No Food Insecurity (09/23/2023)   Hunger Vital Sign    Worried About Running Out of Food in the Last Year: Never true    Ran Out of Food in the Last Year: Never true  Transportation Needs: No Transportation Needs (09/23/2023)   PRAPARE - Administrator, Civil Service (Medical): No    Lack of Transportation (Non-Medical): No  Physical Activity: Not on file  Stress: Not on file  Social Connections: Socially Isolated (09/23/2023)   Social Connection and Isolation Panel    Frequency of Communication with Friends and Family: More than three times a week    Frequency of Social Gatherings with Friends and Family: More than three times a week    Attends Religious Services: Never    Database administrator or Organizations: No    Attends Banker Meetings: Never    Marital Status: Widowed  Intimate Partner Violence: Not At Risk (09/23/2023)   Humiliation, Afraid, Rape, and Kick questionnaire    Fear of Current or Ex-Partner: No    Emotionally Abused: No    Physically Abused: No    Sexually Abused: No     FAMILY HISTORY:  Family History  Problem Relation Age of Onset   Hypertension Mother       REVIEW OF SYSTEMS:  Review of Systems  Unable to perform ROS: Intubated    VITAL SIGNS:  Temp:  [97.6 F (36.4 C)-100.2 F (37.9 C)] 100.2 F (37.9 C) 10-18-23 0900) Pulse Rate:  [33-128] 127 18-Oct-2023 0915) Resp:  [16-38] 29 2023/10/18 0915) BP: (79-122)/(55-79) 122/79 10-18-23 0900) SpO2:  [88 %-96 %] 95 % 18-Oct-2023 0900) Arterial Line BP: (88-148)/(44-71) 125/70 10-18-23 0915) FiO2 (%):  [60 %-100 %] 85 % Oct 18, 2023 1057) Weight:  [83.9 kg] 83.9 kg October 18, 2023 0403)     Height: 5' 6 (167.6 cm) Weight: 83.9 kg BMI (Calculated): 29.87   INTAKE/OUTPUT:  09/21 0701 - October 18, 2023 0700 In: 1889 [P.O.:780; I.V.:1009; IV Piggyback:100] Out: 300 [Urine:300]  PHYSICAL EXAM:  Physical Exam Constitutional:      Appearance: He is ill-appearing.  HENT:     Head: Normocephalic and  atraumatic.  Cardiovascular:     Rate and Rhythm: Tachycardia present.  Pulmonary:     Comments: Patient was intubated.  Abdominal:     General: There is no distension.     Palpations: Abdomen is soft.     Tenderness: There is no abdominal tenderness. There is no guarding.    Labs:     Latest Ref Rng & Units 10-18-2023    1:32 AM 09/23/2023    4:14 AM 09/22/2023    7:35 PM  CBC  WBC 4.0 - 10.5 K/uL 6.9  2.1  5.8   Hemoglobin 13.0 - 17.0 g/dL 83.8  84.7  85.1   Hematocrit 39.0 - 52.0 % 48.3  45.8  43.5   Platelets 150 - 400 K/uL 143  168  186       Latest  Ref Rng & Units 12-Oct-2023    1:32 AM 09/24/2023    3:31 PM 09/23/2023    4:14 AM  CMP  Glucose 70 - 99 mg/dL 884  889  862   BUN 8 - 23 mg/dL 70  60  23   Creatinine 0.61 - 1.24 mg/dL 6.34  7.13  8.98   Sodium 135 - 145 mmol/L 132  133  139   Potassium 3.5 - 5.1 mmol/L 4.8  4.7  3.8   Chloride 98 - 111 mmol/L 92  97  103   CO2 22 - 32 mmol/L 24  22  24    Calcium 8.9 - 10.3 mg/dL 9.0  9.0  8.7     Imaging studies:  CLINICAL DATA:  Generalized weakness and body aches beginning this morning.   EXAM: 09/22/23 CT ANGIOGRAPHY CHEST   CT ABDOMEN AND PELVIS WITH CONTRAST   TECHNIQUE: Multidetector CT imaging of the chest was performed using the standard protocol during bolus administration of intravenous contrast. Multiplanar CT image reconstructions and MIPs were obtained to evaluate the vascular anatomy. Multidetector CT imaging of the abdomen and pelvis was performed using the standard protocol during bolus administration of intravenous contrast.   RADIATION DOSE REDUCTION: This exam was performed according to the departmental dose-optimization program which includes automated exposure control, adjustment of the mA and/or kV according to patient size and/or use of iterative reconstruction technique.   CONTRAST:  OMNIPAQUE  IOHEXOL  350 MG/ML SOLN   COMPARISON:  CTA abdomen 09/23/2009   FINDINGS: CTA CHEST  FINDINGS   Cardiovascular: Moderate cardiomegaly. Left main and 3 vessel atherosclerotic coronary artery disease. Thoracic aorta is normal in caliber. There is moderate calcified plaque throughout the thoracic aorta. Pulmonary arterial system is well opacified and demonstrates no evidence of emboli.   Mediastinum/Nodes: No mediastinal or hilar adenopathy. Remaining mediastinal structures are unremarkable.   Lungs/Pleura: Lungs are adequately inflated demonstrate mild centrilobular emphysematous disease. Small bilateral pleural effusions with associated bibasilar atelectasis. Airways are unremarkable.   Musculoskeletal: No focal abnormality.   Review of the MIP images confirms the above findings.   CT ABDOMEN and PELVIS FINDINGS   Hepatobiliary: Liver, gallbladder and biliary tree are unremarkable.   Pancreas: Normal.   Spleen: Normal.   Adrenals/Urinary Tract: Adrenal glands are normal. Multiple bilateral renal cysts are present with the largest cyst measuring 5 cm over the lower pole left kidney. No further imaging follow-up recommended. There are a few calcifications over the left renal pelvis and corticomedullary junction likely vascular. Few small calcifications in the region of the right renal pelvis likely vascular. There is a 9 mm stone over the right renal pelvis just proximal to the UPJ. The ureters are within normal although the distal ureters are not visualized due to streak artifact from the adjacent left hip prosthesis.   Stomach/Bowel: Stomach is within normal. Appendix is normal. There is diverticulosis of the colon.   Vascular/Lymphatic: There is calcified plaque over the abdominal aorta. Interval treatment of patient's previously seen abdominal aortic aneurysm as there is been placement of aortoiliac stent graft which is patent. 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  Remaining vascular structures are unremarkable. No evidence of adenopathy.   Reproductive: Prostate is unremarkable although partially obscured by streak artifact.   Other: Large right inguinal hernia containing several fluid-filled but nondilated small bowel loops. Small amount of free fluid within the inguinal canal. Mild  ascites.   Musculoskeletal: No focal abnormality.   Review of the MIP images confirms the above findings.   IMPRESSION: 1. No evidence of pulmonary embolism. 2. Small bilateral pleural effusions with associated bibasilar atelectasis. 3. Moderate cardiomegaly. Atherosclerotic coronary artery disease. 4. 9 mm stone over the right renal pelvis just proximal to the UPJ. No evidence of hydronephrosis. 5. Multiple bilateral renal cysts with the largest cyst measuring 5 cm over the lower pole left kidney. No further imaging follow-up recommended. 6. Mild ascites. 7. Colonic diverticulosis. 8. Large right inguinal hernia containing several fluid-filled but nondilated small bowel loops. No evidence of bowel obstruction. Small amount of free fluid within the inguinal canal. 9. Interval treatment of patient's previously seen abdominal aortic aneurysm as there has been placement of aortoiliac stent graft which is patent. 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 10. Aortic atherosclerosis.   Aortic Atherosclerosis (ICD10-I70.0) and Emphysema (ICD10-J43.9).     Electronically Signed   By: Toribio Agreste M.D.   On: 09/22/2023 21:53  CLINICAL DATA:  Sepsis.  History of colon cancer.   EXAM: October 01, 2023 CT CHEST, ABDOMEN AND PELVIS WITHOUT CONTRAST   TECHNIQUE: Multidetector CT imaging of the chest, abdomen and pelvis was performed following the standard protocol without IV contrast.   RADIATION DOSE REDUCTION: This exam was performed according to the departmental dose-optimization  program which includes automated exposure control, adjustment of the mA and/or kV according to patient size and/or use of iterative reconstruction technique.   COMPARISON:  09/22/2023   FINDINGS: CT CHEST FINDINGS   Cardiovascular: Right IJ central venous catheter has tip over the SVC just above the cavoatrial junction. Mild stable cardiomegaly. Calcified plaque over the left main and 3 vessel coronary arteries. Thoracic aorta is normal caliber. There is calcified plaque throughout the thoracic aorta. Remaining vascular structures are unremarkable.   Mediastinum/Nodes: No definite mediastinal or hilar adenopathy. Endotracheal tube in adequate position.   Lungs/Pleura: Lungs are adequately inflated. Mild centrilobular emphysematous disease is present. Interval worsening of posterior bibasilar consolidation most of which is likely due to atelectasis. Interval worsening small bilateral pleural effusions. New hazy patchy airspace process throughout the left lung and right upper lobe concerning for multifocal pneumonia. Airways are unremarkable.   Musculoskeletal: No focal abnormality.   CT ABDOMEN PELVIS FINDINGS   Hepatobiliary: Contrast within a contracted gallbladder. Biliary tree is unremarkable. Subtle nodular contour to the liver without focal mass.   Pancreas: Normal.   Spleen: Normal.   Adrenals/Urinary Tract: Adrenal glands are normal. Kidneys normal size with multiple stable cysts. Stable 9 mm stone over the right renal pelvis no hydronephrosis or perinephric inflammation/fluid. Visualized ureters are unremarkable. Distal ureters are obscured by streak artifact from left hip prosthesis. Foley catheter is present within the bladder which is decompressed.   Stomach/Bowel: Nasogastric tube has tip over the posterior gastric fundus. Several new minimally dilated small bowel loops over the left mid to lower abdomen which may be due to early small-bowel obstruction or  ileus. Small amount of free air over the anterior abdomen. Acute evidence of several small bowel loops within a moderate size right inguinal hernia which may be the site of patient's possible small bowel obstruction. Mild diverticulosis of the colon. Possible suture line over the sigmoid colon. Appendix is normal.   Vascular/Lymphatic: Calcified plaque over the abdominal aorta which is normal in caliber. Aortoiliac stent graft unchanged. Stable dilatation of the  right common iliac artery. No adenopathy.   Reproductive: Prostate is unremarkable.   Other: Mild worsening ascites.   Musculoskeletal: Unchanged.   IMPRESSION: 1. Interval worsening of posterior bibasilar consolidation most of which is likely due to atelectasis. Interval worsening small bilateral pleural effusions. New hazy patchy airspace process throughout the left lung and right upper lobe concerning for multifocal pneumonia. 2. Small amount of free air over the anterior abdomen suggesting bowel perforation. Site of perforation indeterminate. Several new minimally dilated small bowel loops over the left mid to lower abdomen which may be due to early small-bowel obstruction or ileus. Continued visualization of small bowel loops within a moderate size right inguinal hernia which may be the site of patient's possible small bowel obstruction. 3. Mild worsening ascites. 4. Stable 9 mm stone over the right renal pelvis. No hydronephrosis. 5. Mild colonic diverticulosis. 6. Aortic atherosclerosis. Atherosclerotic coronary artery disease.   Aortic Atherosclerosis (ICD10-I70.0) and Emphysema (ICD10-J43.9).   Paging ordering physician.   Electronically Signed: By: Toribio Agreste M.D. On: 03-Oct-2023 09:54   Assessment/Plan: 89 y.o. male with possible bowel perforation, complicated by pertinent comorbidities including acute respiratory distress and intubated, A-fib with RVR, acute diastolic congestive heart failure, renal  stone, and possible UTI.   - History of present illness was limited to family at bedside. Son stated he started complaining of abdominal pain the day of admission. Had a bowel movement that same day. No other GI complaints.  Physical exam today was not reliable due to patient being intubated and non-responsive.  - CT of abdomen from 09/19 showed small amount of free fluid within the inguinal canal.  CT of abdomen from this morning 03-Oct-2023 showed small amount of free air over anterior abdomen suggesting bowel perforation.  However site of perforation was not determined.  Discussed CT findings with Dr. Tye. Given findings, he recommended exploratory laparotomy with possible bowel resection and possible colostomy bag. Discussed other risks that come along with surgery such as bleeding, infection, injury to nearby organs.Given the patient's age and current state, explained to the family that this would be a high risk surgery.  Both sons decided to not proceed with surgery and keep him comfortable. Family understands the risk of not proceeding with surgery and want comfort measures.   Thank you for the opportunity to participate in this patient's care.   -- Gilmer Cea PA-C

## 2023-10-04 NOTE — Progress Notes (Signed)
 Upon initial assessment, patient was diaphoretic, labored breathing and decreased level of consciousness. Notified Dr.Mansy of current position, orders were placed for x-ray and head ct. Patient is aggitated and pulling at medical devices. Respiratory placed patient on 15L HFNC. X-ray and head ct completed. Asked for assistance from ICU charge nurse. Dr. Lawence paged to bedside, patient was transferred to ICU for further treatment.  Both sons were contacted, Krystal returned called and was updated. Family was asked to come to hospital.

## 2023-10-04 NOTE — Procedures (Signed)
 INTUBATION PROCEDURE NOTE  OTTO FELKINS  [id number]  1933-12-23  Date:2023/10/09  Time:1:45 AM   Provider Performing:Mario Coronado A Tilford Deaton   Procedure: Intubation (31500)  Indication(s) Respiratory Failure  Consent Unable to obtain consent due to emergent nature of procedure.  Anesthesia Etomidate , Fentanyl , and Rocuronium   Time Out Verified patient identification, verified procedure, site/side was marked, verified correct patient position, special equipment/implants available, medications/allergies/relevant history reviewed, required imaging and test results available.  Sterile Technique Usual hand hygeine, masks, and gloves were used  Procedure Description Patient positioned in bed supine.  Sedation given as noted above.  Patient was intubated with endotracheal tube using Glidescope.  View was Grade 1 full glottis .  Number of attempts was 1.  Colorimetric CO2 detector was consistent with tracheal placement.  Complications/Tolerance None; patient tolerated the procedure well. Chest X-ray is ordered to verify placement.  EBL Minimal  Specimen(s) None   [name], DNP, CCRN, FNP-C, AGACNP-BC Acute Care & Family Nurse Practitioner  [facility]  See Amion for personal pager PCCM on call pager [phone] until 7 am

## 2023-10-04 NOTE — IPAL (Signed)
 INTERDISCIPLINARY GOALS OF CARE FAMILY MEETING    NAME: Dustin Townsend MRN: [id number] DOB : 02-15-1933 ATTENDING PHYSICIAN: Isadora Hose, MD    Date carried out: 9/22/205 Location of the meeting: Bedside   Member's involved: NP and Family Member or next of kin   Durable Power of Attorney or Environmental health practitioner:    Discussion:  Advance Care Planning/Goals of Care discussion was performed during the course of treatment to decide on type of care right for this patient following admission to the ICU.   I met with patient's family to discuss goals of care in details following  change in patient's current status requiring transfer to the ICU. I Reviewed patient's Labs, available imaging, vital signs including unstable HR and blood pressure requiring multiple pressors  and overall guarded prognosis with the family at the bedside and answered all their question.   Discussed prognosis, expected outcome with or without ongoing aggressive treatments and the options for de-escalation of care.   Diagnosis(es): Acute Hypoxic  Respiratory Failure Secondary to aspiration pneumonia, decompensated CHF and sepsis secondary to UTI, pneumonia and probable intra-abdominal source Prognosis: Guarded Code Status: FULL Disposition: ICU Next Steps:  Family understands the situation. They have consented to all procedures and would like to proceed with full scope of care. They however would like to revisit patient's code status/goals of care should his conditions continue to deteriorate with no meaningful chance of recovery. They state that patient would not like to be dependent on the ventilator.   Family are satisfied with Plan of action and management. All questions answered        Dustin Nose, DNP, FNP-C, AGACNP-BC Acute Care Nurse Practitioner [name] Pager: [phone] [place] [name]

## 2023-10-04 NOTE — Death Summary Note (Signed)
 DEATH SUMMARY   Patient Details  Name: Dustin Townsend MRN: [id number] DOB: 1933/05/26  Admission/Discharge Information   Admit Date:  2023/10/06  Date of Death: Date of Death: 2023/10/09 (Simultaneous filing. User may not have seen previous data.)  Time of Death: Time of Death: 1253 (Simultaneous filing. User may not have seen previous data.)  Length of Stay: 3  Referring Physician: Steva Clotilda DEL, NP   Reason(s) for Hospitalization  Bowel perforation.   Diagnoses  Preliminary cause of death:  Secondary Diagnoses (including complications and co-morbidities):  Principal Problem:   Atrial fibrillation with RVR (HCC) Active Problems:   BPH (benign prostatic hyperplasia)   Acute diastolic CHF (congestive heart failure) (HCC)   Obstruction of right ureteropelvic junction (UPJ) due to stone   UTI (urinary tract infection)   Kidney stone   Brief Hospital Course (including significant findings, care, treatment, and services provided and events leading to death)  Unfortunate case of an 89 year old male patient with a past medical history of diverticulitis complicated by perforated bowels in 1980s presenting to [facility] on 09/20 with acute onset progressive dyspnea orthopnea and abdominal pain.  Overnight on 10/09/23 course was complicated by hypoxic respiratory failure requiring intubation mechanical ventilation.  Coffee-ground material was noted from his NGT.  Stat CT abdomen pelvis was obtained which showed small amount of free air over the anterior abdomen suggesting bowel perforation no identifiable site.  Course complicated by profound shock requiring 2 pressors.   On my exam patient appears uncomfortable tachycardic grimacing to abdominal exam.  He is tachypneic on the vent has diffuse rhonchi bilaterally.   Discussed with surgery regarding her options but appears to be a poor prognosis for surgical intervention and discussed with family the findings and they clearly stated that he would not  want to go to surgical intervention colostomy again.  Given profound shock with very minimal probability of meaningful recovery family have decided to pursue comfort measures only.  Pertinent Labs and Studies  Significant Diagnostic Studies CT CHEST ABDOMEN PELVIS WO CONTRAST Addendum Date: 2023/10/09 ADDENDUM REPORT: 10-09-23 10:22 ADDENDUM: Critical Value/emergent results were called by telephone at the time of interpretation on 2023/10/09 at 10:01 a.m. to provider Dustin Townsend , who verbally acknowledged these results. Electronically Signed   By: Toribio Agreste M.D.   On: 2023/10/09 10:22   Result Date: Oct 09, 2023 CLINICAL DATA:  Sepsis.  History of colon cancer. EXAM: CT CHEST, ABDOMEN AND PELVIS WITHOUT CONTRAST TECHNIQUE: Multidetector CT imaging of the chest, abdomen and pelvis was performed following the standard protocol without IV contrast. RADIATION DOSE REDUCTION: This exam was performed according to the departmental dose-optimization program which includes automated exposure control, adjustment of the mA and/or kV according to patient size and/or use of iterative reconstruction technique. COMPARISON:  2023-10-06 FINDINGS: CT CHEST FINDINGS Cardiovascular: Right IJ central venous catheter has tip over the SVC just above the cavoatrial junction. Mild stable cardiomegaly. Calcified plaque over the left main and 3 vessel coronary arteries. Thoracic aorta is normal caliber. There is calcified plaque throughout the thoracic aorta. Remaining vascular structures are unremarkable. Mediastinum/Nodes: No definite mediastinal or hilar adenopathy. Endotracheal tube in adequate position. Lungs/Pleura: Lungs are adequately inflated. Mild centrilobular emphysematous disease is present. Interval worsening of posterior bibasilar consolidation most of which is likely due to atelectasis. Interval worsening small bilateral pleural effusions. New hazy patchy airspace process throughout the left lung and right upper  lobe concerning for multifocal pneumonia. Airways are unremarkable. Musculoskeletal: No focal abnormality. CT ABDOMEN PELVIS FINDINGS  Hepatobiliary: Contrast within a contracted gallbladder. Biliary tree is unremarkable. Subtle nodular contour to the liver without focal mass. Pancreas: Normal. Spleen: Normal. Adrenals/Urinary Tract: Adrenal glands are normal. Kidneys normal size with multiple stable cysts. Stable 9 mm stone over the right renal pelvis no hydronephrosis or perinephric inflammation/fluid. Visualized ureters are unremarkable. Distal ureters are obscured by streak artifact from left hip prosthesis. Foley catheter is present within the bladder which is decompressed. Stomach/Bowel: Nasogastric tube has tip over the posterior gastric fundus. Several new minimally dilated small bowel loops over the left mid to lower abdomen which may be due to early small-bowel obstruction or ileus. Small amount of free air over the anterior abdomen. Acute evidence of several small bowel loops within a moderate size right inguinal hernia which may be the site of patient's possible small bowel obstruction. Mild diverticulosis of the colon. Possible suture line over the sigmoid colon. Appendix is normal. Vascular/Lymphatic: Calcified plaque over the abdominal aorta which is normal in caliber. Aortoiliac stent graft unchanged. Stable dilatation of the right common iliac artery. No adenopathy. Reproductive: Prostate is unremarkable. Other: Mild worsening ascites. Musculoskeletal: Unchanged. IMPRESSION: 1. Interval worsening of posterior bibasilar consolidation most of which is likely due to atelectasis. Interval worsening small bilateral pleural effusions. New hazy patchy airspace process throughout the left lung and right upper lobe concerning for multifocal pneumonia. 2. Small amount of free air over the anterior abdomen suggesting bowel perforation. Site of perforation indeterminate. Several new minimally dilated small bowel  loops over the left mid to lower abdomen which may be due to early small-bowel obstruction or ileus. Continued visualization of small bowel loops within a moderate size right inguinal hernia which may be the site of patient's possible small bowel obstruction. 3. Mild worsening ascites. 4. Stable 9 mm stone over the right renal pelvis. No hydronephrosis. 5. Mild colonic diverticulosis. 6. Aortic atherosclerosis. Atherosclerotic coronary artery disease. Aortic Atherosclerosis (ICD10-I70.0) and Emphysema (ICD10-J43.9). Paging ordering physician. Electronically Signed: By: Toribio Agreste M.D. On: October 12, 2023 09:54   DG Chest Port 1 View Result Date: Oct 12, 2023 EXAM: 1 VIEW XRAY OF THE CHEST 2023-10-12 03:05:00 AM COMPARISON: 10-12-2023 CLINICAL HISTORY: Central line placement verification FINDINGS: LINES, TUBES AND DEVICES: Endotracheal tube terminates 2 cm above the carina. Right IJ venous catheter terminates at the cavotrial junction. Defibrillator pads overlying the left hemithorax. LUNGS AND PLEURA: Multifocal patchy opacities, left mid lung predominant, mildly progressive and favoring multifocal pneumonia over interstitial edema. Small bilateral pleural effusions. HEART AND MEDIASTINUM: Cardiomegaly. BONES AND SOFT TISSUES: Thoracic aortic atherosclerosis. IMPRESSION: 1. Suspected multifocal pneumonia, less likely asymmetric edema, mildly progressive. 2. Cardiomegaly with small bilateral pleural effusions. 3. Endotracheal tube terminates 2 cm above the carina. Additional support apparatus as above. Electronically signed by: Pinkie Pebbles MD 2023-10-12 03:16 AM EDT RP Workstation: [id number]   DG Chest Port 1 View Result Date: 2023/10/12 CLINICAL DATA:  Intubation EXAM: PORTABLE CHEST 1 VIEW COMPARISON:  09/24/2023 FINDINGS: Endotracheal tube is 4 cm above the carina. NG tube is in the stomach. Cardiomegaly, vascular congestion. Layering bilateral effusions and bibasilar airspace opacities. No pneumothorax.  IMPRESSION: Support devices in expected position as above. Cardiomegaly, vascular congestion. Layering bilateral effusions with bibasilar infiltrates. Electronically Signed   By: Franky Crease M.D.   On: 2023/10/12 01:56   CT HEAD WO CONTRAST ( ) Result Date: October 12, 2023 EXAM: CT HEAD WITHOUT CONTRAST 2023-10-12 12:21:15 AM TECHNIQUE: CT of the head was performed without the administration of intravenous contrast. Automated exposure control, iterative reconstruction, and/or weight based  adjustment of the mA/kV was utilized to reduce the radiation dose to as low as reasonably achievable. COMPARISON: None available. CLINICAL HISTORY: Mental status change, unknown cause. FINDINGS: BRAIN AND VENTRICLES: No acute hemorrhage. No evidence of acute infarct. No hydrocephalus. No extra-axial collection. No mass effect or midline shift. Chronic ischemic white matter changes. 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 ORBITS: No acute abnormality. SINUSES: No acute abnormality. SOFT TISSUES AND SKULL: No acute soft tissue abnormality. No skull fracture. IMPRESSION: 1. No acute intracranial abnormality. 2. Chronic ischemic white matter changes, old left frontal infarct, and mild volume loss. Electronically signed by: Franky Stanford MD 2023-10-08 12:45 AM EDT RP Workstation: [id number]   DG Chest 1 View Result Date: 09/24/2023 CLINICAL DATA:  Dyspnea EXAM: CHEST  1 VIEW COMPARISON:  Chest x-ray 09/22/2023 FINDINGS: Cardiac silhouette is markedly enlarged, unchanged. There are atherosclerotic calcifications of the aorta. There small bilateral pleural effusions and bibasilar infiltrates which have increased from prior. There is no pneumothorax or acute fracture. IMPRESSION: 1. Increasing small bilateral pleural effusions and bibasilar infiltrates. 2. Stable marked cardiomegaly. Electronically Signed   By: Greig Pique M.D.   On: 09/24/2023 23:38   US  RENAL Result Date: 09/24/2023 EXAM: US  Retroperitoneum Complete,  Renal. CLINICAL HISTORY: [id number] AKI (acute kidney injury) (HCC) 309830. AKI (acute kidney injury) (HCC) TECHNIQUE: Real-time ultrasound of the retroperitoneum (complete) with image documentation. COMPARISON: CT abdomen/pelvis dated 09/22/2023. FINDINGS: RIGHT KIDNEY: Measures 10.1 x 3.9 x 5.4 cm (110 ml). 3.3 x 3.3 x 3.1 cm simple right upper pole cyst. 2.4 x 2.2 x 1.8 cm simple right upper pole renal cyst. LEFT KIDNEY: Measures 10.7 x 5.6 x 5.9 cm (188 ml) but is poorly visualized. The known left renal cysts on CT are not well evaluated on ultrasound. BLADDER: Unremarkable as visualized. VASCULATURE: Right pleural effusion. OTHER FINDINGS: Ascites. IMPRESSION: 1. No hydronephrosis. 2. Simple right renal cysts, benign. 3. Poor visualization of the left kidney limits evaluation of known left renal cysts. 4. Right pleural effusion and ascites. Electronically signed by: Pinkie Pebbles MD 09/24/2023 08:45 PM EDT RP Workstation: [id number]   DG Abd 1 View Result Date: 09/24/2023 CLINICAL DATA:  13914 Abdominal distention 13914 EXAM: ABDOMEN - 1 VIEW COMPARISON:  None Available. FINDINGS: The bowel gas pattern is non-obstructive. No evidence of pneumoperitoneum, within the limitations of a supine film. No acute osseous abnormalities. The soft tissues are within normal limits. Surgical changes, devices, tubes and lines: Aorto bi-iliac vascular stent noted. There is left hip arthroplasty with small-to-moderate associated heterotopic ossification. IMPRESSION: *Nonobstructive bowel gas pattern. Electronically Signed   By: Ree Molt M.D.   On: 09/24/2023 16:29   CT Angio Chest PE W and/or Wo Contrast Result Date: 09/22/2023 CLINICAL DATA:  Generalized weakness and body aches beginning this morning. EXAM: CT ANGIOGRAPHY CHEST CT ABDOMEN AND PELVIS WITH CONTRAST TECHNIQUE: Multidetector CT imaging of the chest was performed using the standard protocol during bolus administration of intravenous contrast. Multiplanar  CT image reconstructions and MIPs were obtained to evaluate the vascular anatomy. Multidetector CT imaging of the abdomen and pelvis was performed using the standard protocol during bolus administration of intravenous contrast. RADIATION DOSE REDUCTION: This exam was performed according to the departmental dose-optimization program which includes automated exposure control, adjustment of the mA and/or kV according to patient size and/or use of iterative reconstruction technique. CONTRAST:  OMNIPAQUE  IOHEXOL  350 MG/ML SOLN COMPARISON:  CTA abdomen 09/23/2009 FINDINGS: CTA CHEST FINDINGS Cardiovascular: Moderate cardiomegaly. Left main and 3 vessel  atherosclerotic coronary artery disease. Thoracic aorta is normal in caliber. There is moderate calcified plaque throughout the thoracic aorta. Pulmonary arterial system is well opacified and demonstrates no evidence of emboli. Mediastinum/Nodes: No mediastinal or hilar adenopathy. Remaining mediastinal structures are unremarkable. Lungs/Pleura: Lungs are adequately inflated demonstrate mild centrilobular emphysematous disease. Small bilateral pleural effusions with associated bibasilar atelectasis. Airways are unremarkable. Musculoskeletal: No focal abnormality. Review of the MIP images confirms the above findings. CT ABDOMEN and PELVIS FINDINGS Hepatobiliary: Liver, gallbladder and biliary tree are unremarkable. Pancreas: Normal. Spleen: Normal. Adrenals/Urinary Tract: Adrenal glands are normal. Multiple bilateral renal cysts are present with the largest cyst measuring 5 cm over the lower pole left kidney. No further imaging follow-up recommended. There are a few calcifications over the left renal pelvis and corticomedullary junction likely vascular. Few small calcifications in the region of the right renal pelvis likely vascular. There is a 9 mm stone over the right renal pelvis just proximal to the UPJ. The ureters are within normal although the distal ureters are  not visualized due to streak artifact from the adjacent left hip prosthesis. Stomach/Bowel: Stomach is within normal. Appendix is normal. There is diverticulosis of the colon. Vascular/Lymphatic: There is calcified plaque over the abdominal aorta. Interval treatment of patient's previously seen abdominal aortic aneurysm as there is been placement of aortoiliac stent graft which is patent. 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 Remaining vascular structures are unremarkable. No evidence of adenopathy. Reproductive: Prostate is unremarkable although partially obscured by streak artifact. Other: Large right inguinal hernia containing several fluid-filled but nondilated small bowel loops. Small amount of free fluid within the inguinal canal. Mild ascites. Musculoskeletal: No focal abnormality. Review of the MIP images confirms the above findings. IMPRESSION: 1. No evidence of pulmonary embolism. 2. Small bilateral pleural effusions with associated bibasilar atelectasis. 3. Moderate cardiomegaly. Atherosclerotic coronary artery disease. 4. 9 mm stone over the right renal pelvis just proximal to the UPJ. No evidence of hydronephrosis. 5. Multiple bilateral renal cysts with the largest cyst measuring 5 cm over the lower pole left kidney. No further imaging follow-up recommended. 6. Mild ascites. 7. Colonic diverticulosis. 8. Large right inguinal hernia containing several fluid-filled but nondilated small bowel loops. No evidence of bowel obstruction. Small amount of free fluid within the inguinal canal. 9. Interval treatment of patient's previously seen abdominal aortic aneurysm as there has been placement of aortoiliac stent graft which is patent. 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 10. Aortic atherosclerosis. Aortic Atherosclerosis (ICD10-I70.0)  and Emphysema (ICD10-J43.9). Electronically Signed   By: Toribio Agreste M.D.   On: 09/22/2023 21:53   CT ABDOMEN PELVIS W CONTRAST Result Date: 09/22/2023 CLINICAL DATA:  Generalized weakness and body aches beginning this morning. EXAM: CT ANGIOGRAPHY CHEST CT ABDOMEN AND PELVIS WITH CONTRAST TECHNIQUE: Multidetector CT imaging of the chest was performed using the standard protocol during bolus administration of intravenous contrast. Multiplanar CT image reconstructions and MIPs were obtained to evaluate the vascular anatomy. Multidetector CT imaging of the abdomen and pelvis was performed using the standard protocol during bolus administration of intravenous contrast. RADIATION DOSE REDUCTION: This exam was performed according to the departmental dose-optimization program which includes automated exposure control, adjustment of the mA and/or kV according to patient size and/or use of iterative reconstruction technique. CONTRAST:  OMNIPAQUE  IOHEXOL  350  MG/ML SOLN COMPARISON:  CTA abdomen 09/23/2009 FINDINGS: CTA CHEST FINDINGS Cardiovascular: Moderate cardiomegaly. Left main and 3 vessel atherosclerotic coronary artery disease. Thoracic aorta is normal in caliber. There is moderate calcified plaque throughout the thoracic aorta. Pulmonary arterial system is well opacified and demonstrates no evidence of emboli. Mediastinum/Nodes: No mediastinal or hilar adenopathy. Remaining mediastinal structures are unremarkable. Lungs/Pleura: Lungs are adequately inflated demonstrate mild centrilobular emphysematous disease. Small bilateral pleural effusions with associated bibasilar atelectasis. Airways are unremarkable. Musculoskeletal: No focal abnormality. Review of the MIP images confirms the above findings. CT ABDOMEN and PELVIS FINDINGS Hepatobiliary: Liver, gallbladder and biliary tree are unremarkable. Pancreas: Normal. Spleen: Normal. Adrenals/Urinary Tract: Adrenal glands are normal. Multiple bilateral renal  cysts are present with the largest cyst measuring 5 cm over the lower pole left kidney. No further imaging follow-up recommended. There are a few calcifications over the left renal pelvis and corticomedullary junction likely vascular. Few small calcifications in the region of the right renal pelvis likely vascular. There is a 9 mm stone over the right renal pelvis just proximal to the UPJ. The ureters are within normal although the distal ureters are not visualized due to streak artifact from the adjacent left hip prosthesis. Stomach/Bowel: Stomach is within normal. Appendix is normal. There is diverticulosis of the colon. Vascular/Lymphatic: There is calcified plaque over the abdominal aorta. Interval treatment of patient's previously seen abdominal aortic aneurysm as there is been placement of aortoiliac stent graft which is patent. 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 Remaining vascular structures are unremarkable. No evidence of adenopathy. Reproductive: Prostate is unremarkable although partially obscured by streak artifact. Other: Large right inguinal hernia containing several fluid-filled but nondilated small bowel loops. Small amount of free fluid within the inguinal canal. Mild ascites. Musculoskeletal: No focal abnormality. Review of the MIP images confirms the above findings. IMPRESSION: 1. No evidence of pulmonary embolism. 2. Small bilateral pleural effusions with associated bibasilar atelectasis. 3. Moderate cardiomegaly. Atherosclerotic coronary artery disease. 4. 9 mm stone over the right renal pelvis just proximal to the UPJ. No evidence of hydronephrosis. 5. Multiple bilateral renal cysts with the largest cyst measuring 5 cm over the lower pole left kidney. No further imaging follow-up recommended. 6. Mild ascites. 7. Colonic diverticulosis. 8. Large right inguinal hernia containing several fluid-filled but  nondilated small bowel loops. No evidence of bowel obstruction. Small amount of free fluid within the inguinal canal. 9. Interval treatment of patient's previously seen abdominal aortic aneurysm as there has been placement of aortoiliac stent graft which is patent. 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 10. Aortic atherosclerosis. Aortic Atherosclerosis (ICD10-I70.0) and Emphysema (ICD10-J43.9). Electronically Signed   By: Toribio Agreste M.D.   On: 09/22/2023 21:53   DG Chest Portable 1 View Result Date: 09/22/2023 EXAM: 1 VIEW XRAY OF THE CHEST 09/22/2023 07:54:00 PM COMPARISON: 01/15/2018 CLINICAL HISTORY: Weakness. Pt BIB EMS from home with complaints of generalized weakness and body aches that started this morning. Pt is afebrile. FINDINGS: LUNGS AND PLEURA: Left lower lobe airspace opacity. Blunting of left costophrenic angle. HEART AND MEDIASTINUM: Cardiomegaly. Aortic calcification. BONES AND SOFT TISSUES: No acute osseous abnormality. IMPRESSION: 1. Left lower lobe airspace opacity, suspicious for pneumonia. 2. Possible small left pleural effusion. Electronically signed by: Pinkie Pebbles MD 09/22/2023 07:59 PM EDT RP Workstation: [id number]  Microbiology Recent Results (from the past 240 hours)  Resp panel by RT-PCR (RSV, Flu A&B, Covid) Anterior Nasal Swab     Status: None   Collection Time: 09/22/23  7:46 PM   Specimen: Anterior Nasal Swab  Result Value Ref Range Status   SARS Coronavirus 2 by RT PCR NEGATIVE NEGATIVE Final    Comment: (NOTE) SARS-CoV-2 target nucleic acids are NOT DETECTED.  The SARS-CoV-2 RNA is generally detectable in upper respiratory specimens during the acute phase of infection. The lowest concentration of SARS-CoV-2 viral copies this assay can detect is 138 copies/mL. A negative result does not preclude SARS-Cov-2 infection and should not be used as the sole basis for  treatment or other patient management decisions. A negative result may occur with  improper specimen collection/handling, submission of specimen other than nasopharyngeal swab, presence of viral mutation(s) within the areas targeted by this assay, and inadequate number of viral copies(<138 copies/mL). A negative result must be combined with clinical observations, patient history, and epidemiological information. The expected result is Negative.  Fact Sheet for Patients:  BloggerCourse.com  Fact Sheet for Healthcare Providers:  SeriousBroker.it  This test is no t yet approved or cleared by the [place]  FDA and  has been authorized for detection and/or diagnosis of SARS-CoV-2 by FDA under an Emergency Use Authorization (EUA). This EUA will remain  in effect (meaning this test can be used) for the duration of the COVID-19 declaration under Section 564(b)(1) of the Act, 21 U.S.C.section 360bbb-3(b)(1), unless the authorization is terminated  or revoked sooner.       Influenza A by PCR NEGATIVE NEGATIVE Final   Influenza B by PCR NEGATIVE NEGATIVE Final    Comment: (NOTE) The Xpert Xpress SARS-CoV-2/FLU/RSV plus assay is intended as an aid in the diagnosis of influenza from Nasopharyngeal swab specimens and should not be used as a sole basis for treatment. Nasal washings and aspirates are unacceptable for Xpert Xpress SARS-CoV-2/FLU/RSV testing.  Fact Sheet for Patients: BloggerCourse.com  Fact Sheet for Healthcare Providers: SeriousBroker.it  This test is not yet approved or cleared by the [place]  FDA and has been authorized for detection and/or diagnosis of SARS-CoV-2 by FDA under an Emergency Use Authorization (EUA). This EUA will remain in effect (meaning this test can be used) for the duration of the COVID-19 declaration under Section 564(b)(1) of the Act, 21  U.S.C. section 360bbb-3(b)(1), unless the authorization is terminated or revoked.     Resp Syncytial Virus by PCR NEGATIVE NEGATIVE Final    Comment: (NOTE) Fact Sheet for Patients: BloggerCourse.com  Fact Sheet for Healthcare Providers: SeriousBroker.it  This test is not yet approved or cleared by the [place]  FDA and has been authorized for detection and/or diagnosis of SARS-CoV-2 by FDA under an Emergency Use Authorization (EUA). This EUA will remain in effect (meaning this test can be used) for the duration of the COVID-19 declaration under Section 564(b)(1) of the Act, 21 U.S.C. section 360bbb-3(b)(1), unless the authorization is terminated or revoked.  Performed at [facility] [facility], [address] [place], [place] [address]   Blood culture (routine x 2)     Status: None (Preliminary result)   Collection Time: 09/22/23  7:46 PM   Specimen: BLOOD  Result Value Ref Range Status   Specimen Description BLOOD BLOOD LEFT ARM  Final   Special Requests   Final    BOTTLES DRAWN AEROBIC AND ANAEROBIC Blood Culture adequate volume   Culture   Final    NO GROWTH  3 DAYS Performed at [facility], [address] [place], [place] [address]    Report Status PENDING  Incomplete  Blood culture (routine x 2)     Status: None (Preliminary result)   Collection Time: 09/22/23  7:47 PM   Specimen: BLOOD  Result Value Ref Range Status   Specimen Description BLOOD BLOOD RIGHT ARM  Final   Special Requests   Final    BOTTLES DRAWN AEROBIC AND ANAEROBIC Blood Culture adequate volume   Culture   Final    NO GROWTH 3 DAYS Performed at [facility], [address] [place], [place] [address]    Report Status PENDING  Incomplete  Urine Culture     Status: Abnormal (Preliminary result)   Collection Time: 09/24/23  9:25 AM   Specimen: Urine, Clean Catch  Result Value Ref Range Status   Specimen Description    Final    URINE, CLEAN CATCH Performed at [facility], [address], [place], [place] [address]    Special Requests   Final    NONE Performed at [facility], [address], [place], [place] [address]    Culture (A)  Final    40,000 COLONIES/mL PSEUDOMONAS AERUGINOSA SUSCEPTIBILITIES TO FOLLOW Performed at [facility], [address] [place], [place] [address]    Report Status PENDING  Incomplete  MRSA Next Gen by PCR, Nasal     Status: None   Collection Time: 09-Oct-2023  1:54 AM   Specimen: Nasal Mucosa; Nasal Swab  Result Value Ref Range Status   MRSA by PCR Next Gen NOT DETECTED NOT DETECTED Final    Comment: (NOTE) The GeneXpert MRSA Assay (FDA approved for NASAL specimens only), is one component of a comprehensive MRSA colonization surveillance program. It is not intended to diagnose MRSA infection nor to guide or monitor treatment for MRSA infections. Test performance is not FDA approved in patients less than 73 years old. Performed at [facility], [address] [place], [place] [address]     Lab Basic Metabolic Panel: Recent Labs  Lab 09/22/23 1935 09/23/23 0414 09/24/23 1531 10/09/23 0132  NA 138 139 133* 132*  K 3.4* 3.8 4.7 4.8  CL 103 103 97* 92*  CO2 22 24 22 24   GLUCOSE 161* 137* 110* 115*  BUN 20 23 60* 70*  CREATININE 0.96 1.01 2.86* 3.65*  CALCIUM 9.1 8.7* 9.0 9.0   Liver Function Tests: Recent Labs  Lab 09/22/23 1935  AST 21  ALT 16  ALKPHOS 83  BILITOT 1.8*  PROT 6.8  ALBUMIN 3.7   Recent Labs  Lab 09/22/23 1935  LIPASE 23   No results for input(s): AMMONIA in the last 168 hours. CBC: Recent Labs  Lab 09/22/23 1935 09/23/23 0414 Oct 09, 2023 0132  WBC 5.8 2.1* 6.9  NEUTROABS 4.6  --   --   HGB 14.8 15.2 16.1  HCT 43.5 45.8 48.3  MCV 98.0 98.3 98.4  PLT 186 168 143*   Cardiac Enzymes: No results for input(s): CKTOTAL, CKMB, CKMBINDEX, TROPONINI in the last 168 hours. Sepsis  Labs: Recent Labs  Lab 09/22/23 1935 09/22/23 2219 09/23/23 0414 10/09/23 0132 10-09-2023 0235 10/09/2023 0400  PROCALCITON  --   --   --  >150.00  --   --   WBC 5.8  --  2.1* 6.9  --   --   LATICACIDVEN 2.0* 2.1*  --   --  3.1* 2.6*    Procedures/Operations  ET intubation/CVC  and Aline placement 2023/10/07.    Jean-Pierre Dura Mccormack 2023-10-07, 3:07 PM

## 2023-10-04 NOTE — Progress Notes (Signed)
 eLink Physician-Brief Progress Note Patient Name: BLAYTON HUTTNER DOB: 08-Sep-1933 MRN: [id number]   Date of Service  10-16-2023  HPI/Events of Note  61 M hx of hypertension, dyslipidemia, CVA, nephrolithiasis, initially presented 9/19 with generalized weakness and shortness of breath. noted to be in afib RVR and with LE edema started on diltiazem  drip and diuresed. Also started on ceftriaxone  for pneumonia/UTI.  Transferred to ICU overnight 10/16/2023 likely had aspiration event after vomiting and is now intubated. NGT inserted with 400 cc output. CXR with bibasilar infiltrates vs layering effusion.  eICU Interventions  Aspiration event with concern for ileus. CT abdomen pending. Central line to be inserted as requiring pressors Discussed with [facility]     Intervention Category Evaluation Type: New Patient Evaluation  Damien ONEIDA Grout 10/16/23, 1:56 AM

## 2023-10-04 NOTE — Progress Notes (Signed)
   2023-10-09 1300  Spiritual Encounters  Type of Visit Initial  Care provided to: Family  Referral source Chaplain assessment  Reason for visit Urgent spiritual support  OnCall Visit Yes  Interventions  Spiritual Care Interventions Made Compassionate presence;Established relationship of care and support  Intervention Outcomes  Outcomes Connection to spiritual care   Chaplain provided compassionate presence and encouraged the family. The family decided not to do the procedures that were being offered by hospital.

## 2023-10-04 NOTE — Progress Notes (Signed)
 Heart Failure Navigator Progress Note  Assessed for Heart & Vascular TOC clinic readiness.  Patient does not meet criteria due to current [facility]iology patient.   Navigator will sign off at this time.  Roxy Horseman, RN, BSN [facility] Heart Failure Navigator Secure Chat Only

## 2023-10-04 NOTE — Procedures (Signed)
 CENTRAL VENOUS CATHETER INSERTION PROCEDURE NOTE  Dustin Townsend  [id number]  1933-06-09  Date:10/07/23  Time:3:24 AM   Provider Performing:Maleni Seyer A Cher Franzoni   Procedure: Insertion of Non-tunneled Central Venous [phone]) with US  guidance (23062)   Indication(s) Medication administration and Difficult access  Consent Risks of the procedure as well as the alternatives and risks of each were explained to the patient and/or caregiver.  Consent for the procedure was obtained and is signed in the bedside chart  Anesthesia Topical only with 1% lidocaine   Timeout Verified patient identification, verified procedure, site/side was marked, verified correct patient position, special equipment/implants available, medications/allergies/relevant history reviewed, required imaging and test results available.  Sterile Technique Maximal sterile technique including full sterile barrier drape, hand hygiene, sterile gown, sterile gloves, mask, hair covering, sterile ultrasound probe cover (if used).  Procedure Description Area of catheter insertion was cleaned with chlorhexidine  and draped in sterile fashion.  With real-time ultrasound guidance a central venous catheter was placed into the right internal jugular vein. Nonpulsatile blood flow and easy flushing noted in all ports.  The catheter was sutured in place and sterile dressing applied.  Complications/Tolerance None; patient tolerated the procedure well. Chest X-ray is ordered to verify placement for internal jugular or subclavian cannulation.   Chest x-ray is not ordered for femoral cannulation.  EBL Minimal  Specimen(s) None   Dustin Nose, DNP, CCRN, FNP-C, AGACNP-BC Acute Care & Family Nurse Practitioner  [facility]  See Amion for personal pager PCCM on call pager [phone] until 7 am

## 2023-10-04 NOTE — Progress Notes (Signed)
 Critical care note:  Date of note: Oct 15, 2023.  Subjective: The patient has been having worsening altered mental status with decreased responsiveness and increasing dyspnea and tachypnea as well as tachycardia this evening, requiring high flow nasal cannula at 50 L/min with sats of 91%.  He was fairly lethargic.  No chest pain.  No fever or chills.  He was initially given digoxin  0.25 mg. ABG, portable chest x-ray and head CT scan were ordered.  Objective: Physical examination: Generally: Acutely ill elderly Caucasian male in moderate respiratory distress and fairly lethargic Vital signs: BP was 97/59 then 102 78 and later 79/55 with a MAP of 63 heart rate was 107 and later 111 and respiratory rate has been 34-37 with pulse oximetry of 91% on 15 L of O2 by HFNC Head - atraumatic, normocephalic.  Pupils - equal, round and reactive to light and accommodation. Extraocular movements are intact. No scleral icterus.  Oropharynx - moist mucous membranes and tongue. No pharyngeal erythema or exudate.  Neck - supple. No JVD. Carotid pulses 2+ bilaterally. No carotid bruits. No palpable thyromegaly or lymphadenopathy. Cardiovascular - regular rate and rhythm. Normal S1 and S2. No murmurs, gallops or rubs.  Lungs -diminished bibasal breath sounds with bibasal crackles. Abdomen - soft and nontender. Positive bowel sounds. No palpable organomegaly or masses.  Extremities - no pitting edema, clubbing or cyanosis.  Neuro - grossly non-focal. Skin - no rashes. GU and rectal exam - deferred.   Labs and notes were reviewed.  Stat portable chest x-ray showed the following: 1. Increasing small bilateral pleural effusions and bibasilar infiltrates. 2. Stable marked cardiomegaly.  ABG revealed pH of 7.41, HCO3 23.5 and pCO2 37 with pO2 of 65%.  Lactic acid was 2.1 earlier.  Stat head CT scan revealed chronic ischemic white matter changes, old left frontal infarct and mild volume loss with no acute ventricular  abnormality.  Assessment/plan: 1.  Acute respiratory failure with hypoxia likely secondary to multifocal pneumonia due to aspiration with differential diagnosis including pulmonary edema and possibly acute cor pulmonale. - The patient was transferred to the ICU. - He was  not a candidate for BiPAP due to significant respiratory distress. - He was given IV fentanyl  and etomidate  and was intubated by the ICU team. - The care was transferred to the ICU team.  Authorized and performed by: Madison Peaches, MD Total critical care time:    35    minutes. Due to a high probability of clinically significant, life-threatening deterioration, the patient required my highest level of preparedness to intervene emergently and I personally spent this critical care time directly and personally managing the patient.  This critical care time included obtaining a history, examining the patient, pulse oximetry, ordering and review of studies, arranging urgent treatment with development of management plan, evaluation of patient's response to treatment, frequent reassessment, and discussions with other providers. This critical care time was performed to assess and manage the high probability of imminent, life-threatening deterioration that could result in multiorgan failure.  It was exclusive of separately billable procedures and treating other patients and teaching time.

## 2023-10-04 NOTE — Progress Notes (Signed)
 PT Cancellation Note  Patient Details Name: ROMONE SHAFF MRN: [id number] DOB: 05/12/1933   Cancelled Treatment:    Reason Eval/Treat Not Completed: Medical issues which prohibited therapy: Per chart review pt emergently intubated and per MD PT/OT orders to be completed at this time.     CHARM Glendia Bertin PT, DPT October 20, 2023, 10:29 AM

## 2023-10-04 DEATH — deceased
